# Patient Record
Sex: Male | Born: 1990 | Race: Black or African American | Hispanic: No | Marital: Single | Smoking: Current every day smoker
Health system: Southern US, Community
[De-identification: ages and names within clinical notes are randomized; demographics above are authoritative.]

## PROBLEM LIST (undated history)

## (undated) DIAGNOSIS — B019 Varicella without complication: Secondary | ICD-10-CM

## (undated) HISTORY — DX: Varicella without complication: B01.9

## (undated) HISTORY — PX: TONSILLECTOMY: SUR1361

---
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
## 2010-12-12 ENCOUNTER — Encounter: Payer: Self-pay | Admitting: *Deleted

## 2010-12-12 ENCOUNTER — Emergency Department (HOSPITAL_COMMUNITY)
Admission: EM | Admit: 2010-12-12 | Discharge: 2010-12-12 | Disposition: A | Discharge status: Home / Self Care | Payer: Managed Care, Other (non HMO) | Attending: Emergency Medicine | Admitting: Emergency Medicine

## 2010-12-12 DIAGNOSIS — L03211 Cellulitis of face: Secondary | ICD-10-CM | POA: Insufficient documentation

## 2010-12-12 DIAGNOSIS — L0201 Cutaneous abscess of face: Secondary | ICD-10-CM | POA: Insufficient documentation

## 2010-12-12 MED ORDER — IBUPROFEN 800 MG PO TABS: 800.0000 mg | ORAL_TABLET | Freq: Three times a day (TID) | ORAL | Status: AC | PRN

## 2010-12-12 MED ORDER — DOXYCYCLINE HYCLATE 100 MG PO CAPS: 100.0000 mg | ORAL_CAPSULE | Freq: Two times a day (BID) | ORAL | Status: AC

## 2010-12-12 NOTE — ED Provider Notes (Signed)
Medical screening examination/treatment/procedure(s) were performed by non-physician practitioner and as supervising physician I was immediately available for consultation/collaboration.  Autie Vasudevan, MD 12/12/10 1835 

## 2010-12-12 NOTE — ED Notes (Signed)
Small bump on head, now larger and painful

## 2010-12-12 NOTE — ED Provider Notes (Signed)
History     CSN: [id number] Arrival date & time: 12/12/2010 11:26 AM   First MD Initiated Contact with Patient 12/12/10 1341      Chief Complaint  Patient presents with  . Mass    (Consider location/radiation/quality/duration/timing/severity/associated sxs/prior treatment) HPI Comments: Patient with small area of redness and swelling on the forehead just superior to the bridge of the nose that began yesterday. Patient denies drainage from the area. Patient denies fever or other masses in head or neck. Patient has been treating himself at home with ibuprofen however no treatment prior to arrival.  Patient is a 20 y.o. male presenting with abscess. The history is provided by the patient.  Abscess  This is a new problem. The current episode started yesterday. The problem has been unchanged. The problem is mild. The abscess is characterized by swelling. Pertinent negatives include no fever. He has received no recent medical care.    History reviewed. No pertinent past medical history.  History reviewed. No pertinent past surgical history.  No family history on file.  History  Substance Use Topics  . Smoking status: Current Everyday Smoker  . Smokeless tobacco: Not on file  . Alcohol Use: Yes     social      Review of Systems  Constitutional: Negative for fever and chills.  Eyes: Negative for redness.  Musculoskeletal: Negative for myalgias.  Skin: Positive for color change. Negative for rash.  Neurological: Negative for headaches.  Hematological: Negative for adenopathy.    Allergies  Review of patient's allergies indicates no known allergies.  Home Medications   Current Outpatient Rx  Name Route Sig Dispense Refill  . VITAMIN C PO Oral Take 1 tablet by mouth as needed.        BP 122/56  Pulse 78  Temp(Src) 99.4 F (37.4 C) (Oral)  Resp 20  SpO2 100%  Physical Exam  Nursing note and vitals reviewed. Constitutional: He appears well-developed and  well-nourished.  HENT:  Head: Normocephalic and atraumatic.       Patient with small, less than 5 cm area of induration on the mid forehead just superior to bridge of nose. There is no drainage from the area. No surrounding cellulitis.  Eyes: Conjunctivae are normal. Pupils are equal, round, and reactive to light. Right eye exhibits no discharge. Left eye exhibits no discharge.  Musculoskeletal: He exhibits no edema.  Lymphadenopathy:    He has no cervical adenopathy.  Skin: Skin is warm and dry. No rash noted. There is erythema.  Psychiatric: He has a normal mood and affect.    ED Course  Procedures (including critical care time)  Labs Reviewed - No data to display No results found.   1. Abscess of forehead    Patient was seen and examined. Counseled on use of warm compresses. Counseled on use of anti-inflammatories. Urged patient to return with worsening swelling, redness spreading over his forehead, fever, or if he has any other concerns. He verbalizes understanding and agrees with plan.   MDM  Patient with small forehead abscess, no drainage performed 2/2 to small size and location. Antibiotics prescribed the patient will continue conservative treatment.        Eustace Moore [place], [place] 12/12/10 [phone]

## 2011-07-22 ENCOUNTER — Emergency Department (HOSPITAL_COMMUNITY)
Admission: EM | Admit: 2011-07-22 | Discharge: 2011-07-22 | Disposition: A | Discharge status: Home / Self Care | Payer: Managed Care, Other (non HMO) | Attending: Emergency Medicine | Admitting: Emergency Medicine

## 2011-07-22 ENCOUNTER — Encounter (HOSPITAL_COMMUNITY): Payer: Self-pay | Admitting: *Deleted

## 2011-07-22 DIAGNOSIS — J039 Acute tonsillitis, unspecified: Secondary | ICD-10-CM | POA: Insufficient documentation

## 2011-07-22 MED ORDER — HYDROCODONE-ACETAMINOPHEN 5-325 MG PO TABS: 2.0000 | ORAL_TABLET | ORAL | Status: AC | PRN

## 2011-07-22 MED ORDER — CLINDAMYCIN HCL 300 MG PO CAPS: 300.0000 mg | ORAL_CAPSULE | Freq: Four times a day (QID) | ORAL | Status: AC

## 2011-07-22 MED ORDER — ACETAMINOPHEN 325 MG PO TABS
650.0000 mg | ORAL_TABLET | Freq: Once | ORAL | Status: AC
  Administered 2011-07-22: 650 mg via ORAL
  Filled 2011-07-22: qty 2

## 2011-07-22 NOTE — ED Provider Notes (Signed)
History     CSN: [id number]  Arrival date & time 07/22/11  1939   First MD Initiated Contact with Patient 07/22/11 2019      Chief Complaint  Patient presents with  . Sore Throat   HPI  History provided by the patient. Patient is a 21 year old male with no significant past medical history who presents with complaints of acute onset sore throat and bilateral earache and fever last night and early this morning. He states symptoms began with slight ear pressure and ache last night and awoke with a significantly worse or throat and fever and chills all day long. Patient has continued to have good by mouth intake of fluids but does have slight decreased appetite. Patient also reports some occasional nausea but denies any episodes of vomiting. Has been no significant coughing. Patient denies any chest pain, shortness of breath or heart palpitations. He denies any skin rash.   History reviewed. No pertinent past medical history.  History reviewed. No pertinent past surgical history.  No family history on file.  History  Substance Use Topics  . Smoking status: Current Everyday Smoker  . Smokeless tobacco: Not on file  . Alcohol Use: Yes     social      Review of Systems  Constitutional: Positive for fever, chills and diaphoresis.  HENT: Positive for ear pain and sore throat. Negative for hearing loss.   Respiratory: Negative for cough.   Gastrointestinal: Positive for nausea. Negative for vomiting and abdominal pain.  Skin: Negative for rash.    Allergies  Review of patient's allergies indicates no known allergies.  Home Medications   Current Outpatient Rx  Name Route Sig Dispense Refill  . VITAMIN C 500 MG PO TABS Oral Take 500 mg by mouth daily.      BP 112/65  Pulse 110  Temp 102.9 F (39.4 C)  Resp 20  SpO2 100%  Physical Exam  Nursing note and vitals reviewed. Constitutional: He is oriented to person, place, and time. He appears well-developed and  well-nourished. No distress.  HENT:  Head: Normocephalic.  Right Ear: Tympanic membrane normal.  Left Ear: Tympanic membrane normal.       Tonsils enlarged bilaterally with erythema small amounts of exudate. Uvula midline. No signs for PTA.  Cardiovascular: Normal rate and regular rhythm.   Pulmonary/Chest: Effort normal and breath sounds normal.  Abdominal: Soft. There is no tenderness.  Lymphadenopathy:    He has cervical adenopathy.  Neurological: He is alert and oriented to person, place, and time.  Skin: Skin is warm.  Psychiatric: He has a normal mood and affect. His behavior is normal.    ED Course  Procedures   Results for orders placed during the hospital encounter of 07/22/11  RAPID STREP SCREEN      Component Value Range   Streptococcus, Group A Screen (Direct) NEGATIVE  NEGATIVE       1. Tonsillitis       MDM  8:20PM patient seen and evaluated. Patient does not appear acutely ill or toxic.        Angus Seller, [place] 07/22/11 2101

## 2011-07-22 NOTE — ED Notes (Signed)
Fever; sore throat; bilateral ear pain.

## 2011-07-22 NOTE — ED Notes (Signed)
Pt verbalizes understanding 

## 2011-07-22 NOTE — ED Provider Notes (Signed)
Medical screening examination/treatment/procedure(s) were performed by non-physician practitioner and as supervising physician I was immediately available for consultation/collaboration.   Robel Wuertz A Jazae Gandolfi, MD 07/22/11 2353 

## 2011-09-01 ENCOUNTER — Emergency Department (HOSPITAL_COMMUNITY)
Admission: EM | Admit: 2011-09-01 | Discharge: 2011-09-01 | Disposition: A | Discharge status: Home / Self Care | Payer: Managed Care, Other (non HMO) | Attending: Emergency Medicine | Admitting: Emergency Medicine

## 2011-09-01 ENCOUNTER — Encounter (HOSPITAL_COMMUNITY): Payer: Self-pay | Admitting: *Deleted

## 2011-09-01 DIAGNOSIS — F17200 Nicotine dependence, unspecified, uncomplicated: Secondary | ICD-10-CM | POA: Insufficient documentation

## 2011-09-01 DIAGNOSIS — IMO0002 Reserved for concepts with insufficient information to code with codable children: Secondary | ICD-10-CM | POA: Insufficient documentation

## 2011-09-01 DIAGNOSIS — J95830 Postprocedural hemorrhage and hematoma of a respiratory system organ or structure following a respiratory system procedure: Secondary | ICD-10-CM

## 2011-09-01 DIAGNOSIS — Y849 Medical procedure, unspecified as the cause of abnormal reaction of the patient, or of later complication, without mention of misadventure at the time of the procedure: Secondary | ICD-10-CM | POA: Insufficient documentation

## 2011-09-01 NOTE — ED Notes (Signed)
Pt reports having tonsillectomy on 8/20-reports waking up this am and his throat was bleeding, states he was spitting up blood.  No bleeding noted at this time.

## 2011-09-01 NOTE — ED Provider Notes (Signed)
History     CSN: [id number]  Arrival date & time 09/01/11  1226   First MD Initiated Contact with Patient 09/01/11 1310      Chief Complaint  Patient presents with  . Coagulation Disorder    bleeding    (Consider location/radiation/quality/duration/timing/severity/associated sxs/prior treatment) HPI Comments: Douglas Bartlett is a 21 y.o. Male who presents with complaint of post surgical bleeding. Pt states he had a tonsillectomy 1 week ago. States was doing well, today when he woke up, he noted bleeding from one of his tonsils. PT states he spit out about 5 mouth fulls of blood. States came here, but by the time he got here. Bleeding seemed to subside. Pt denies increased pain or injury. No headache, fever, dizziness.    History reviewed. No pertinent past medical history.  Past Surgical History  Procedure Date  . Tonsillectomy     No family history on file.  History  Substance Use Topics  . Smoking status: Current Some Day Smoker    Types: Cigars  . Smokeless tobacco: Not on file  . Alcohol Use: Yes     social      Review of Systems  Constitutional: Negative for fever and chills.  HENT: Positive for sore throat. Negative for congestion and drooling.   Respiratory: Negative.   Cardiovascular: Negative.   Skin: Negative.   Neurological: Negative for dizziness, weakness, light-headedness, numbness and headaches.  Hematological: Does not bruise/bleed easily.    Allergies  Review of patient's allergies indicates no known allergies.  Home Medications   Current Outpatient Rx  Name Route Sig Dispense Refill  . OXYCODONE-ACETAMINOPHEN 5-325 MG/5ML PO SOLN Oral Take 5-10 mLs by mouth every 4 (four) hours as needed. Pain      BP 114/72  Pulse 81  Temp 99 F (37.2 C) (Oral)  Resp 20  SpO2 99%  Physical Exam  Nursing note and vitals reviewed. Constitutional: He appears well-developed and well-nourished. No distress.  HENT:  Head: Normocephalic and atraumatic.   Right Ear: External ear normal.  Left Ear: External ear normal.  Nose: Nose normal.       Post surgical post tonsillectomy changes. There is mild oozing from the left tonsil with a clot formed at the site of the bleeding, otherwise normal. Uvula midline.   Neck: Neck supple.  Cardiovascular: Normal rate, regular rhythm and normal heart sounds.   Pulmonary/Chest: Effort normal and breath sounds normal. No respiratory distress. He has no wheezes. He has no rales.  Lymphadenopathy:    He has no cervical adenopathy.  Neurological: He is alert.  Skin: Skin is warm and dry.  Psychiatric: He has a normal mood and affect.    ED Course  Procedures (including critical care time)  Pt with post surgical bleeding form left tonsil. Exam and VS otherwise unremarkable. He is in no distress. I spoke with dr. Jearld Fenton who did her surgery. He advised ice water gargles, if bleeding stops, pt OK to be d/c home with follow up if bleeding starts again or reporting to [facility] ED.   2:28 PM Pt gargled ice cold water. His bleeding resolved. There is still a clot over left tonsil. Pt OK to go home. Voices understanding and will return to [facility] ER if worsening.     1. Hemorrhage following tonsillectomy       MDM          Lottie Mussel, PA 09/01/11 1519

## 2011-09-02 NOTE — ED Provider Notes (Signed)
Medical screening examination/treatment/procedure(s) were performed by non-physician practitioner and as supervising physician I was immediately available for consultation/collaboration.    Ladene Allocca R Jarrod Bodkins, MD 09/02/11 1640 

## 2014-06-15 ENCOUNTER — Encounter (HOSPITAL_COMMUNITY): Payer: Self-pay | Admitting: Emergency Medicine

## 2014-06-15 ENCOUNTER — Emergency Department (HOSPITAL_COMMUNITY): Payer: Managed Care, Other (non HMO)

## 2014-06-15 ENCOUNTER — Emergency Department (HOSPITAL_COMMUNITY)
Admission: EM | Admit: 2014-06-15 | Discharge: 2014-06-15 | Disposition: A | Discharge status: Home / Self Care | Payer: Managed Care, Other (non HMO) | Attending: Emergency Medicine | Admitting: Emergency Medicine

## 2014-06-15 DIAGNOSIS — S6991XA Unspecified injury of right wrist, hand and finger(s), initial encounter: Secondary | ICD-10-CM | POA: Diagnosis present

## 2014-06-15 DIAGNOSIS — S60511A Abrasion of right hand, initial encounter: Secondary | ICD-10-CM | POA: Insufficient documentation

## 2014-06-15 DIAGNOSIS — Z23 Encounter for immunization: Secondary | ICD-10-CM | POA: Insufficient documentation

## 2014-06-15 DIAGNOSIS — Z720 Tobacco use: Secondary | ICD-10-CM | POA: Insufficient documentation

## 2014-06-15 DIAGNOSIS — S60221A Contusion of right hand, initial encounter: Secondary | ICD-10-CM | POA: Insufficient documentation

## 2014-06-15 DIAGNOSIS — Y998 Other external cause status: Secondary | ICD-10-CM | POA: Insufficient documentation

## 2014-06-15 DIAGNOSIS — Y92013 Bedroom of single-family (private) house as the place of occurrence of the external cause: Secondary | ICD-10-CM | POA: Insufficient documentation

## 2014-06-15 DIAGNOSIS — Y9389 Activity, other specified: Secondary | ICD-10-CM | POA: Diagnosis not present

## 2014-06-15 DIAGNOSIS — W2209XA Striking against other stationary object, initial encounter: Secondary | ICD-10-CM | POA: Insufficient documentation

## 2014-06-15 MED ORDER — IBUPROFEN 600 MG PO TABS: 600.0000 mg | ORAL_TABLET | Freq: Four times a day (QID) | ORAL | Status: DC | PRN

## 2014-06-15 MED ORDER — TETANUS-DIPHTH-ACELL PERTUSSIS 5-2.5-18.5 LF-MCG/0.5 IM SUSP
0.5000 mL | Freq: Once | INTRAMUSCULAR | Status: AC
  Administered 2014-06-15: 0.5 mL via INTRAMUSCULAR
  Filled 2014-06-15: qty 0.5

## 2014-06-15 MED ORDER — HYDROCODONE-ACETAMINOPHEN 5-325 MG PO TABS
1.0000 | ORAL_TABLET | Freq: Once | ORAL | Status: AC
  Administered 2014-06-15: 1 via ORAL
  Filled 2014-06-15: qty 1

## 2014-06-15 MED ORDER — HYDROCODONE-ACETAMINOPHEN 5-325 MG PO TABS: 1.0000 | ORAL_TABLET | Freq: Four times a day (QID) | ORAL | Status: DC | PRN
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
## 2014-06-15 NOTE — Discharge Instructions (Signed)
You likely have bruising of the hand. Your x-ray is negative for break. He should take pain medication at home and use rest, ice, compression, and elevation for treatment.   Contusion A contusion is a deep bruise. Contusions are the result of an injury that caused bleeding under the skin. The contusion may turn blue, purple, or yellow. Minor injuries will give you a painless contusion, but more severe contusions may stay painful and swollen for a few weeks.  CAUSES  A contusion is usually caused by a blow, trauma, or direct force to an area of the body. SYMPTOMS   Swelling and redness of the injured area.  Bruising of the injured area.  Tenderness and soreness of the injured area.  Pain. DIAGNOSIS  The diagnosis can be made by taking a history and physical exam. An X-ray, CT scan, or MRI may be needed to determine if there were any associated injuries, such as fractures. TREATMENT  Specific treatment will depend on what area of the body was injured. In general, the best treatment for a contusion is resting, icing, elevating, and applying cold compresses to the injured area. Over-the-counter medicines may also be recommended for pain control. Ask your caregiver what the best treatment is for your contusion. HOME CARE INSTRUCTIONS   Put ice on the injured area.  Put ice in a plastic bag.  Place a towel between your skin and the bag.  Leave the ice on for 15-20 minutes, 3-4 times a day, or as directed by your health care provider.  Only take over-the-counter or prescription medicines for pain, discomfort, or fever as directed by your caregiver. Your caregiver may recommend avoiding anti-inflammatory medicines (aspirin, ibuprofen, and naproxen) for 48 hours because these medicines may increase bruising.  Rest the injured area.  If possible, elevate the injured area to reduce swelling. SEEK IMMEDIATE MEDICAL CARE IF:   You have increased bruising or swelling.  You have pain that is  getting worse.  Your swelling or pain is not relieved with medicines. MAKE SURE YOU:   Understand these instructions.  Will watch your condition.  Will get help right away if you are not doing well or get worse. Document Released: 09/30/2004 Document Revised: 12/26/2012 Document Reviewed: 10/26/2010 [facility] Patient Information 2015 [place], [place]. This information is not intended to replace advice given to you by your health care provider. Make sure you discuss any questions you have with your health care provider.

## 2014-06-24 ENCOUNTER — Emergency Department (HOSPITAL_COMMUNITY)
Admission: EM | Admit: 2014-06-24 | Discharge: 2014-06-24 | Disposition: A | Discharge status: Home / Self Care | Payer: Managed Care, Other (non HMO) | Attending: Emergency Medicine | Admitting: Emergency Medicine

## 2014-06-24 ENCOUNTER — Encounter (HOSPITAL_COMMUNITY): Payer: Self-pay | Admitting: *Deleted

## 2014-06-24 DIAGNOSIS — S80862A Insect bite (nonvenomous), left lower leg, initial encounter: Secondary | ICD-10-CM | POA: Diagnosis not present

## 2014-06-24 DIAGNOSIS — Y92009 Unspecified place in unspecified non-institutional (private) residence as the place of occurrence of the external cause: Secondary | ICD-10-CM | POA: Diagnosis not present

## 2014-06-24 DIAGNOSIS — Z720 Tobacco use: Secondary | ICD-10-CM | POA: Diagnosis not present

## 2014-06-24 DIAGNOSIS — Y999 Unspecified external cause status: Secondary | ICD-10-CM | POA: Diagnosis not present

## 2014-06-24 DIAGNOSIS — Y939 Activity, unspecified: Secondary | ICD-10-CM | POA: Diagnosis not present

## 2014-06-24 DIAGNOSIS — S50861A Insect bite (nonvenomous) of right forearm, initial encounter: Secondary | ICD-10-CM | POA: Diagnosis not present

## 2014-06-24 DIAGNOSIS — W57XXXA Bitten or stung by nonvenomous insect and other nonvenomous arthropods, initial encounter: Secondary | ICD-10-CM | POA: Diagnosis not present

## 2014-06-24 DIAGNOSIS — S40861A Insect bite (nonvenomous) of right upper arm, initial encounter: Secondary | ICD-10-CM | POA: Insufficient documentation

## 2014-06-24 NOTE — ED Notes (Signed)
Pt complains of a rash that started yesterday. Pt states he initially thought he had a mosquito bite on his right arm, but that the itching became worse and spread to his left leg and right arm.

## 2014-06-24 NOTE — ED Notes (Signed)
Pt reports mosquito bites to R posterior FA yesterday, woke up today with swelling and redness to bite area.

## 2014-06-24 NOTE — Discharge Instructions (Signed)
RECOMMEND TOPICAL BENADRYL FOR ITCHING AND IBUPROFEN FOR INFLAMMATION AND DISCOMFORT.   Insect Bite Mosquitoes, flies, fleas, bedbugs, and many other insects can bite. Insect bites are different from insect stings. A sting is when venom is injected into the skin. Some insect bites can transmit infectious diseases. SYMPTOMS  Insect bites usually turn red, swell, and itch for 2 to 4 days. They often go away on their own. TREATMENT  Your caregiver may prescribe antibiotic medicines if a bacterial infection develops in the bite. HOME CARE INSTRUCTIONS  Do not scratch the bite area.  Keep the bite area clean and dry. Wash the bite area thoroughly with soap and water.  Put ice or cool compresses on the bite area.  Put ice in a plastic bag.  Place a towel between your skin and the bag.  Leave the ice on for 20 minutes, 4 times a day for the first 2 to 3 days, or as directed.  You may apply a baking soda paste, cortisone cream, or calamine lotion to the bite area as directed by your caregiver. This can help reduce itching and swelling.  Only take over-the-counter or prescription medicines as directed by your caregiver.  If you are given antibiotics, take them as directed. Finish them even if you start to feel better. You may need a tetanus shot if:  You cannot remember when you had your last tetanus shot.  You have never had a tetanus shot.  The injury broke your skin. If you get a tetanus shot, your arm may swell, get red, and feel warm to the touch. This is common and not a problem. If you need a tetanus shot and you choose not to have one, there is a rare chance of getting tetanus. Sickness from tetanus can be serious. SEEK IMMEDIATE MEDICAL CARE IF:   You have increased pain, redness, or swelling in the bite area.  You see a red line on the skin coming from the bite.  You have a fever.  You have joint pain.  You have a headache or neck pain.  You have unusual  weakness.  You have a rash.  You have chest pain or shortness of breath.  You have abdominal pain, nausea, or vomiting.  You feel unusually tired or sleepy. MAKE SURE YOU:   Understand these instructions.  Will watch your condition.  Will get help right away if you are not doing well or get worse. Document Released: 01/29/2004 Document Revised: 03/15/2011 Document Reviewed: 07/22/2010 [facility] Patient Information 2015 [place], [place]. This information is not intended to replace advice given to you by your health care provider. Make sure you discuss any questions you have with your health care provider.

## 2014-06-24 NOTE — ED Provider Notes (Signed)
CSN: [id number]     Arrival date & time 06/24/14  1906 History  This chart was scribed for non-physician practitioner Elpidio Anis, PA-C working with Gilda Crease, MD by Lyndel Safe, ED Scribe. This patient was seen in room WTR8/WTR8 and the patient's care was started at 8:34 PM.   Chief Complaint  Patient presents with  . Rash   The history is provided by the patient. No language interpreter was used.   HPI Comments: Douglas Bartlett is a 24 y.o. male, with no pertinent PMhx, who presents to the Emergency Department complaining of progressively spreading, constant, moderate pruritic bumps onset 1 day ago. The pt states he thought he had mosquito bites on his right forearm yesterday but since he has noticed the bumps on his right arm/hand and left leg with a worsening pruritic quality. Pt reports he has been at his place of residence for 5 years. Denies fever, SOB, or dyspnea.    History reviewed. No pertinent past medical history. Past Surgical History  Procedure Laterality Date  . Tonsillectomy     No family history on file. History  Substance Use Topics  . Smoking status: Current Some Day Smoker    Types: Cigars  . Smokeless tobacco: Not on file  . Alcohol Use: Yes     Comment: social    Review of Systems  Constitutional: Negative for fever.  Respiratory: Negative for shortness of breath.   Skin: Positive for rash.    Allergies  Review of patient's allergies indicates no known allergies.  Home Medications   Prior to Admission medications   Medication Sig Start Date End Date Taking? Authorizing Provider  HYDROcodone-acetaminophen (NORCO/VICODIN) 5-325 MG per tablet Take 1 tablet by mouth every 6 (six) hours as needed for moderate pain. 06/15/14   Shon Baton, MD  ibuprofen (ADVIL,MOTRIN) 600 MG tablet Take 1 tablet (600 mg total) by mouth every 6 (six) hours as needed. 06/15/14   Shon Baton, MD  oxyCODONE-acetaminophen (ROXICET) 5-325 MG/5ML  solution Take 5-10 mLs by mouth every 4 (four) hours as needed. Pain    Historical Provider, MD   BP 141/80 mmHg  Pulse 60  Temp(Src) 98.2 F (36.8 C) (Oral)  Resp 18  SpO2 100% Physical Exam  Constitutional: He is oriented to person, place, and time. He appears well-developed and well-nourished. No distress.  HENT:  Head: Normocephalic.  Cardiovascular: Normal rate.   Pulmonary/Chest: Effort normal.  Musculoskeletal: Normal range of motion.  Neurological: He is alert and oriented to person, place, and time.  Skin: Skin is warm.  Diffuse, singular, raised bumps slightly erythematous. No blistering. No pustules. Consistent with insect bites.   Psychiatric: He has a normal mood and affect. His behavior is normal.  Nursing note and vitals reviewed.   ED Course  Procedures  DIAGNOSTIC STUDIES: Oxygen Saturation is 100% on RA, normal by my interpretation.    COORDINATION OF CARE: 8:35 PM Discussed treatment plan with pt. Discussed with pt that rash is likely from bed bugs. Advised pt to treat his home for bed bugs. Pt acknowledges and agrees to plan.   Labs Review Labs Reviewed - No data to display  Imaging Review No results found.   EKG Interpretation None      MDM   Final diagnoses:  None    1. Insect bites  Uncomplicated insect bites without secondary infection.  I personally performed the services described in this documentation, which was scribed in my presence. The recorded information has  been reviewed and is accurate.     Elpidio Anis, PA-C 06/25/14 1324  Gilda Crease, MD 07/01/14 [phone]

## 2014-11-12 ENCOUNTER — Other Ambulatory Visit (INDEPENDENT_AMBULATORY_CARE_PROVIDER_SITE_OTHER): Payer: Managed Care, Other (non HMO)

## 2014-11-12 ENCOUNTER — Telehealth: Payer: Self-pay | Admitting: Family

## 2014-11-12 ENCOUNTER — Ambulatory Visit (INDEPENDENT_AMBULATORY_CARE_PROVIDER_SITE_OTHER): Payer: Managed Care, Other (non HMO) | Admitting: Family

## 2014-11-12 ENCOUNTER — Encounter: Payer: Self-pay | Admitting: Family

## 2014-11-12 VITALS — BP 110/68 | HR 87 | Temp 98.6°F | Resp 18 | Ht 74.0 in | Wt 211.0 lb

## 2014-11-12 DIAGNOSIS — R55 Syncope and collapse: Secondary | ICD-10-CM

## 2014-11-12 LAB — CBC
HEMATOCRIT: 46.4 % (ref 39.0–52.0)
Hemoglobin: 15.4 g/dL (ref 13.0–17.0)
MCHC: 33.2 g/dL (ref 30.0–36.0)
MCV: 87 fl (ref 78.0–100.0)
Platelets: 281 10*3/uL (ref 150.0–400.0)
RBC: 5.34 Mil/uL (ref 4.22–5.81)
RDW: 13.9 % (ref 11.5–15.5)
WBC: 5.1 10*3/uL (ref 4.0–10.5)

## 2014-11-12 LAB — COMPREHENSIVE METABOLIC PANEL
ALBUMIN: 4.3 g/dL (ref 3.5–5.2)
ALT: 21 U/L (ref 0–53)
AST: 19 U/L (ref 0–37)
Alkaline Phosphatase: 61 U/L (ref 39–117)
BILIRUBIN TOTAL: 0.7 mg/dL (ref 0.2–1.2)
BUN: 11 mg/dL (ref 6–23)
CHLORIDE: 105 meq/L (ref 96–112)
CO2: 28 meq/L (ref 19–32)
CREATININE: 0.95 mg/dL (ref 0.40–1.50)
Calcium: 9.5 mg/dL (ref 8.4–10.5)
GFR: 124.45 mL/min (ref >60.00)
Glucose, Bld: 89 mg/dL (ref 70–99)
Potassium: 4.1 mEq/L (ref 3.5–5.1)
SODIUM: 139 meq/L (ref 135–145)
Total Protein: 7.8 g/dL (ref 6.0–8.3)

## 2014-11-12 LAB — HEMOGLOBIN A1C: HEMOGLOBIN A1C: 5.4 % (ref 4.6–6.5)

## 2014-11-12 NOTE — Patient Instructions (Addendum)
Thank you for choosing [facility].  Summary/Instructions:  Please stop by the lab on the basement level of the building for your blood work. Your results will be released to MyChart (or called to you) after review, usually within 72 hours after test completion. If any changes need to be made, you will be notified at that same time.  If your symptoms worsen or fail to improve, please contact our office for further instruction, or in case of emergency go directly to the emergency room at the closest medical facility.    Syncope Syncope is a medical term for fainting or passing out. This means you lose consciousness and drop to the ground. People are generally unconscious for less than 5 minutes. You may have some muscle twitches for up to 15 seconds before waking up and returning to normal. Syncope occurs more often in older adults, but it can happen to anyone. While most causes of syncope are not dangerous, syncope can be a sign of a serious medical problem. It is important to seek medical care.  CAUSES  Syncope is caused by a sudden drop in blood flow to the brain. The specific cause is often not determined. Factors that can bring on syncope include:  Taking medicines that lower blood pressure.  Sudden changes in posture, such as standing up quickly.  Taking more medicine than prescribed.  Standing in one place for too long.  Seizure disorders.  Dehydration and excessive exposure to heat.  Low blood sugar (hypoglycemia).  Straining to have a bowel movement.  Heart disease, irregular heartbeat, or other circulatory problems.  Fear, emotional distress, seeing blood, or severe pain. SYMPTOMS  Right before fainting, you may:  Feel dizzy or light-headed.  Feel nauseous.  See all white or all black in your field of vision.  Have cold, clammy skin. DIAGNOSIS  Your health care provider will ask about your symptoms, perform a physical exam, and perform an electrocardiogram  (ECG) to record the electrical activity of your heart. Your health care provider may also perform other heart or blood tests to determine the cause of your syncope which may include:  Transthoracic echocardiogram (TTE). During echocardiography, sound waves are used to evaluate how blood flows through your heart.  Transesophageal echocardiogram (TEE).  Cardiac monitoring. This allows your health care provider to monitor your heart rate and rhythm in real time.  Holter monitor. This is a portable device that records your heartbeat and can help diagnose heart arrhythmias. It allows your health care provider to track your heart activity for several days, if needed.  Stress tests by exercise or by giving medicine that makes the heart beat faster. TREATMENT  In most cases, no treatment is needed. Depending on the cause of your syncope, your health care provider may recommend changing or stopping some of your medicines. HOME CARE INSTRUCTIONS  Have someone stay with you until you feel stable.  Do not drive, use machinery, or play sports until your health care provider says it is okay.  Keep all follow-up appointments as directed by your health care provider.  Lie down right away if you start feeling like you might faint. Breathe deeply and steadily. Wait until all the symptoms have passed.  Drink enough fluids to keep your urine clear or pale yellow.  If you are taking blood pressure or heart medicine, get up slowly and take several minutes to sit and then stand. This can reduce dizziness. SEEK IMMEDIATE MEDICAL CARE IF:   You have a severe  headache.  You have unusual pain in the chest, abdomen, or back.  You are bleeding from your mouth or rectum, or you have black or tarry stool.  You have an irregular or very fast heartbeat.  You have pain with breathing.  You have repeated fainting or seizure-like jerking during an episode.  You faint when sitting or lying down.  You have  confusion.  You have trouble walking.  You have severe weakness.  You have vision problems. If you fainted, call your local emergency services (911 in [place] Do not drive yourself to the hospital.    This information is not intended to replace advice given to you by your health care provider. Make sure you discuss any questions you have with your health care provider.   Document Released: 12/21/2004 Document Revised: 05/07/2014 Document Reviewed: 02/19/2011 Elsevier Interactive Patient Education [employer]2016 Elsevier Inc.

## 2014-11-12 NOTE — Progress Notes (Signed)
Pre visit review using our clinic review tool, if applicable. No additional management support is needed unless otherwise documented below in the visit note. 

## 2014-11-12 NOTE — Assessment & Plan Note (Signed)
Symptoms of multiple syncopal episodes of undetermined origin and cannot rule out vasovagal events. Most recent event occurred with alcohol and does not recall how much he drank that evening. Obtain A1c, CMET and CBC to rule out potential metabolic causes. In office EKG revealed normal sinus rhythm with non-specific changes that are problaby normal for age. Obtain 2D echo. Follow up pending lab work and imaging.

## 2014-11-12 NOTE — Telephone Encounter (Signed)
Please inform patient that his blood work is all within the normal limits including his kidney function, liver function, electrolytes, white/red blood cells, and A1c. Therefore please continue with the 2-D echocardiogram to rule out any cardiac involvement. With this information it appears as his symptoms may be related to the vasovagal symptoms that we discussed during his office visit. Follow-up pending his 2-D echocardiogram.

## 2014-11-12 NOTE — Progress Notes (Signed)
Subjective:    Patient ID: Douglas Bartlett, male    DOB: 1990-09-01, 24 y.o.   MRN: [id number]  Chief Complaint  Patient presents with  . Establish Care    has had issues with passing out, has happend 4 times since he has had his tonsils removed in 2013    HPI:  Douglas Bartlett is a 24 y.o. male who  has a past medical history of Chicken pox. and presents today for an office visit to establish care.  1.) Syncopal episodes - Approximately 3 weeks ago he was out with friends and noted feeling hot and warm and then next thing that he remembers is people standing over him and describes a feeling of weakness that follows for about 5-10 minutes and then he is improved. There was alcohol involved.Frequency has occurred about 4 times within the last 3 years. Denies any modifying factors or treatments that make it better or worse. Does have a history of trauma falling down the steps when he was in middle school hitting his head with no LOC. Currently reports no symptoms.   No Known Allergies   Outpatient Prescriptions Prior to Visit  Medication Sig Dispense Refill  . HYDROcodone-acetaminophen (NORCO/VICODIN) 5-325 MG per tablet Take 1 tablet by mouth every 6 (six) hours as needed for moderate pain. 10 tablet 0  . ibuprofen (ADVIL,MOTRIN) 600 MG tablet Take 1 tablet (600 mg total) by mouth every 6 (six) hours as needed. 30 tablet 0  . oxyCODONE-acetaminophen (ROXICET) 5-325 MG/5ML solution Take 5-10 mLs by mouth every 4 (four) hours as needed. Pain     No facility-administered medications prior to visit.     Past Medical History  Diagnosis Date  . Chicken pox      Past Surgical History  Procedure Laterality Date  . Tonsillectomy       Family History  Problem Relation Age of Onset  . Hypertension Mother   . Healthy Father      Social History   Social History  . Marital Status: Single    Spouse Name: N/A  . Number of Children: 0  . Years of Education: 12   Occupational  History  . 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 Smoking status: Former Smoker    Types: Cigars  . Smokeless tobacco: Never Used  . Alcohol Use: Yes     Comment: social  . Drug Use: No  . Sexual Activity: Not on file   Other Topics Concern  . Not on file   Social History Narrative   Fun: Sherri RadHang out with friends   Denies religious beliefs effecting health care.        Review of Systems  Constitutional: Negative for fever and chills.  Respiratory: Negative for chest tightness and shortness of breath.   Cardiovascular: Negative for chest pain, palpitations and leg swelling.  Neurological: Negative for headaches.      Objective:    BP 110/68 mmHg  Pulse 87  Temp(Src) 98.6 F (37 C) (Oral)  Resp 18  Ht 6\' 2"  (1.88 m)  Wt 211 lb (95.709 kg)  BMI 27.08 kg/m2  SpO2 99% Nursing note and vital signs reviewed.  Physical Exam  Constitutional: He is oriented to person, place, and time. He appears well-developed and well-nourished. No distress.  Cardiovascular: Normal rate, regular rhythm, normal heart sounds and intact distal pulses.   Pulmonary/Chest: Effort normal and breath sounds normal.  Neurological: He is alert and oriented to person,  place, and time.  Skin: Skin is warm and dry.  Psychiatric: He has a normal mood and affect. His behavior is normal. Judgment and thought content normal.       Assessment & Plan:   Problem List Items Addressed This Visit      Cardiovascular and Mediastinum   Syncopal episodes - Primary    Symptoms of multiple syncopal episodes of undetermined origin and cannot rule out vasovagal events. Most recent event occurred with alcohol and does not recall how much he drank that evening. Obtain A1c, CMET and CBC to rule out potential metabolic causes. In office EKG revealed normal sinus rhythm with non-specific changes that are problaby normal for age. Obtain 2D echo. Follow up pending lab work and imaging.       Relevant Orders    Comprehensive metabolic panel   CBC   Hemoglobin A1c   EKG 12-Lead (Completed)   Echocardiogram

## 2014-11-14 NOTE — Telephone Encounter (Signed)
Tried to call pt. No answer and VM was full. Will try back later

## 2014-11-19 ENCOUNTER — Telehealth (HOSPITAL_COMMUNITY): Payer: Self-pay | Admitting: *Deleted

## 2014-11-25 NOTE — Telephone Encounter (Signed)
Results sent in the mail. 

## 2014-12-02 ENCOUNTER — Ambulatory Visit (HOSPITAL_COMMUNITY): Payer: Managed Care, Other (non HMO) | Attending: Internal Medicine

## 2014-12-02 ENCOUNTER — Telehealth: Payer: Self-pay | Admitting: Family

## 2014-12-02 ENCOUNTER — Other Ambulatory Visit: Payer: Self-pay

## 2014-12-02 DIAGNOSIS — R55 Syncope and collapse: Secondary | ICD-10-CM | POA: Diagnosis present

## 2014-12-02 NOTE — Telephone Encounter (Signed)
Please inform patient that his Echocardiogram was normal. Therefore his passing out may be related to a vasovagal episode and does not appear to be cardiac related.

## 2014-12-06 NOTE — Telephone Encounter (Signed)
LVM letting pt know.  

## 2015-03-27 ENCOUNTER — Ambulatory Visit (INDEPENDENT_AMBULATORY_CARE_PROVIDER_SITE_OTHER): Payer: Managed Care, Other (non HMO) | Admitting: Physician Assistant

## 2015-03-27 VITALS — BP 122/74 | HR 68 | Temp 98.9°F | Resp 16 | Ht 73.0 in | Wt 219.0 lb

## 2015-03-27 DIAGNOSIS — S239XXA Sprain of unspecified parts of thorax, initial encounter: Secondary | ICD-10-CM

## 2015-03-27 MED ORDER — CYCLOBENZAPRINE HCL 5 MG PO TABS: 5.0000 mg | ORAL_TABLET | Freq: Three times a day (TID) | ORAL | Status: DC | PRN

## 2015-03-27 MED ORDER — DICLOFENAC SODIUM 75 MG PO TBEC
75.0000 mg | DELAYED_RELEASE_TABLET | Freq: Two times a day (BID) | ORAL | Status: DC
Start: 2015-03-27 — End: 2015-05-26

## 2015-03-27 NOTE — Progress Notes (Signed)
Subjective:     Patient ID: Douglas Bartlett, male   DOB: Apr 29, 1990, 25 y.o.   MRN: [id number]  HPI  The patient is a 25 year old healthy male who presents with 3 days of upper back pain. He woke up 2 mornings ago with sharp pain, states the pain occurs only with certain movements, and when heavy lifting. Location is in the lower left boarder of the left scapula. Denies pain radiation to the next, upper neck, or lower back. No nausea, vomiting, no neurologic deficits. States it is a sharp pain, stabbing 7/10. Denies headache, dizziness or difficulty with arm movements.   Review of Systems All pertinent ROS as above in HPI    Objective:   Physical Exam  Constitutional: He appears well-developed and well-nourished.  Eyes: EOM are normal. Pupils are equal, round, and reactive to light.  Neck: Normal range of motion. Neck supple.  Musculoskeletal: Normal range of motion.  Full active ROM of bilateral upper extremities, pain with shoulder internal rotation and flexion. Full 5/5 muscle strength in all upper extremities bilaterally.  Mild tenderness to palpation over the lower scapula boarder, no muscle spasm noted.  Cervical, thoracic and lumbar vertebrae non tender to palpation  Neurological: He is alert. He has normal reflexes. No cranial nerve deficit.       Assessment:     The patient is a 25 year old otherwise healthy male who lifts heavy objects for work, woke up two days ago with sharp pain in his left upper back. Likely a rhomboid or subscapularis muscle spasm/sprain will treat symptomatically and encourage patient to continue to use muscle to avoid further damage or atrophy.    Plan:     1. Thoracic back sprain, initial encounter - diclofenac (VOLTAREN) 75 MG EC tablet; Take 1 tablet (75 mg total) by mouth 2 (two) times daily.  Dispense: 30 tablet; Refill: 0 - cyclobenzaprine (FLEXERIL) 5 MG tablet; Take 1 tablet (5 mg total) by mouth 3 (three) times daily as needed for muscle spasms.   Dispense: 30 tablet; Refill: 0 - Continue to use heating pad and use muscle indicated

## 2015-03-27 NOTE — Progress Notes (Signed)
   Douglas Bartlett  MRN: [id number] DOB: [birth year]  Subjective:  The patient is a 25 year old healthy male who presents with 3 days of upper back pain. He woke up 2 mornings ago with sharp pain, states the pain occurs only with certain movements, and when heavy lifting. Location is in the lower left boarder of the left scapula. Denies pain radiation to the next, upper neck, or lower back. No nausea, vomiting, no neurologic deficits. States it is a sharp pain, stabbing 7/10. Denies headache, dizziness or difficulty with arm movements.   Patient Active Problem List   Diagnosis Date Noted  . Syncopal episodes 11/12/2014    No current outpatient prescriptions on file prior to visit.   No current facility-administered medications on file prior to visit.    No Known Allergies  Review of Systems  Musculoskeletal: Positive for back pain.   Objective:  BP 122/74 mmHg  Pulse 68  Temp(Src) 98.9 F (37.2 C)  Resp 16  Ht 6\' 1"  (1.854 m)  Wt 219 lb (99.338 kg)  BMI 28.90 kg/m2  SpO2 98%  Physical Exam  Constitutional: He is oriented to person, place, and time and well-developed, well-nourished, and in no distress.  HENT:  Head: Normocephalic and atraumatic.  Right Ear: External ear normal.  Left Ear: External ear normal.  Eyes: Conjunctivae are normal.  Neck: Normal range of motion.  Pulmonary/Chest: Effort normal.  Musculoskeletal:       Thoracic back: He exhibits tenderness. He exhibits normal range of motion, no bony tenderness and no spasm.       Back:  Neurological: He is alert and oriented to person, place, and time. He has normal sensation, normal strength and normal reflexes. Gait normal.  Skin: Skin is warm and dry.  Psychiatric: Mood, memory, affect and judgment normal.    Assessment and Plan :  Thoracic back sprain, initial encounter - Plan: diclofenac (VOLTAREN) 75 MG EC tablet, cyclobenzaprine (FLEXERIL) 5 MG tablet   Heat and gentle stretches - he will be off  tomorrow with the provided note to allow him to take the medication vs work around machinery.  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  He will need to RTC for a return to work fit for duty exam.  Benny LennertSarah Weber PA-C  [facility] 03/27/2015 2:34 PM

## 2015-03-27 NOTE — Patient Instructions (Addendum)
  Heat and gentle exercises - do not stop using the muscle as that will make it worse. Be mindful that the muscle relaxer may make you sleepy so do not use the 1st dose before you know how it will affect you.   IF you received an x-ray today, you will receive an invoice from [facility]. Please contact [facility] at [phone] with questions or concerns regarding your invoice.   IF you received labwork today, you will receive an invoice from [facility]. Please contact [facility] at [phone] with questions or concerns regarding your invoice.   Our billing staff will not be able to assist you with questions regarding bills from these companies.  You will be contacted with the lab results as soon as they are available. The fastest way to get your results is to activate your My Chart account. Instructions are located on the last page of this paperwork. If you have not heard from us regarding the results in 2 weeks, please contact this office.

## 2015-03-31 ENCOUNTER — Ambulatory Visit (INDEPENDENT_AMBULATORY_CARE_PROVIDER_SITE_OTHER): Payer: Managed Care, Other (non HMO) | Admitting: Physician Assistant

## 2015-03-31 VITALS — BP 120/80 | HR 75 | Temp 98.9°F | Resp 16 | Ht 73.0 in | Wt 220.0 lb

## 2015-03-31 DIAGNOSIS — S239XXA Sprain of unspecified parts of thorax, initial encounter: Secondary | ICD-10-CM

## 2015-03-31 DIAGNOSIS — Z09 Encounter for follow-up examination after completed treatment for conditions other than malignant neoplasm: Secondary | ICD-10-CM | POA: Diagnosis not present

## 2015-03-31 NOTE — Patient Instructions (Addendum)
     IF you received an x-ray today, you will receive an invoice from [facility]. Please contact [facility] at [phone] with questions or concerns regarding your invoice.   IF you received labwork today, you will receive an invoice from [facility]. Please contact [facility] at [phone] with questions or concerns regarding your invoice.   Our billing staff will not be able to assist you with questions regarding bills from these companies.  You will be contacted with the lab results as soon as they are available. The fastest way to get your results is to activate your My Chart account. Instructions are located on the last page of this paperwork. If you have not heard from us regarding the results in 2 weeks, please contact this office.    Continue to do stretches 3 times per day.   Take it easy for this next week.  Be mindful of your lifting and positioning prior to lifting.   I also would like you to start exercising your core to strengthen your back, abdomen, and shoulders.  This will help you to distribute the weight of heavy materials as well as avoiding strains if you contort your body a certain type of way.

## 2015-03-31 NOTE — Progress Notes (Signed)
[facility] [address], [place] [address] [phone]  Date:  03/31/2015   Name:  Douglas Lindennthony L Casto   DOB:  [birth year]   MRN:  [id number]  PCP:  Jeanine Luzalone, Gregory, FNP   Chief Complaint  Patient presents with  . Follow-up    return to duty     History of Present Illness:  Douglas Bartlett is a 25 y.o. male patient who [facility].    He reports resolve of his thoracic back pain. He has been doing stretches at home, and using a heating pad on his thoracic. He states that the muscle relaxer helped dramatically.  He has been off work as restriction for this thoracic back pain.     Patient Active Problem List   Diagnosis Date Noted  . Syncopal episodes 11/12/2014    Past Medical History  Diagnosis Date  . Chicken pox     Past Surgical History  Procedure Laterality Date  . Tonsillectomy      Social History  Substance Use Topics  . Smoking status: Former Smoker    Types: Cigarettes    Quit date: 01/05/2011  . Smokeless tobacco: Never Used  . Alcohol Use: 6.0 oz/week    10 Cans of beer per week     Comment: social    Family History  Problem Relation Age of Onset  . Hypertension Mother   . Healthy Father     No Known Allergies  Medication list has been reviewed and updated.  Current Outpatient Prescriptions on File Prior to Visit  Medication Sig Dispense Refill  . cyclobenzaprine (FLEXERIL) 5 MG tablet Take 1 tablet (5 mg total) by mouth 3 (three) times daily as needed for muscle spasms. 30 tablet 0  . diclofenac (VOLTAREN) 75 MG EC tablet Take 1 tablet (75 mg total) by mouth 2 (two) times daily. 30 tablet 0   No current facility-administered medications on file prior to visit.    ROS ROS otherwise unremarkable unless listed above.  Physical Examination: BP 120/80 mmHg  Pulse 75  Temp(Src) 98.9 F (37.2 C) (Oral)  Resp 16  Ht 6\' 1"  (1.854 m)  Wt 220 lb (99.791 kg)  BMI 29.03 kg/m2  SpO2  97% Ideal Body Weight: Weight in (lb) to have BMI = 25: 189.1  Physical Exam  Constitutional: He is oriented to person, place, and time. He appears well-developed and well-nourished. No distress.  HENT:  Head: Normocephalic and atraumatic.  Eyes: Conjunctivae and EOM are normal. Pupils are equal, round, and reactive to light.  Cardiovascular: Normal rate.   Pulmonary/Chest: Effort normal. No respiratory distress.  Musculoskeletal:       Thoracic back: Normal. He exhibits normal range of motion, no tenderness, no bony tenderness, no swelling, no pain and no spasm.  Neurological: He is alert and oriented to person, place, and time.  Skin: Skin is warm and dry. He is not diaphoretic.  Psychiatric: He has a normal mood and affect. His behavior is normal.     Assessment and Plan: Douglas Lindennthony L Brannick is a 25 y.o. male who is here today for follow up of thoracic back sprain. Resolved--fit for duty form completed. Thoracic back sprain, initial encounter  Follow up   Trena PlattStephanie English, PA-C [facility] 03/31/2015 10:44 AM

## 2015-05-26 ENCOUNTER — Ambulatory Visit (INDEPENDENT_AMBULATORY_CARE_PROVIDER_SITE_OTHER): Payer: Managed Care, Other (non HMO) | Admitting: Family

## 2015-05-26 ENCOUNTER — Encounter: Payer: Self-pay | Admitting: Family

## 2015-05-26 VITALS — BP 122/80 | HR 73 | Temp 98.4°F | Resp 14 | Ht 73.0 in | Wt 216.0 lb

## 2015-05-26 DIAGNOSIS — R21 Rash and other nonspecific skin eruption: Secondary | ICD-10-CM | POA: Diagnosis not present

## 2015-05-26 MED ORDER — TRIAMCINOLONE ACETONIDE 0.025 % EX OINT: 1.0000 "application " | TOPICAL_OINTMENT | Freq: Two times a day (BID) | CUTANEOUS | Status: DC

## 2015-05-26 NOTE — Patient Instructions (Signed)
Thank you for choosing [facility].  Summary/Instructions:  Your prescription(s) have been submitted to your pharmacy or been printed and provided for you. Please take as directed and contact our office if you believe you are having problem(s) with the medication(s) or have any questions.  If your symptoms worsen or fail to improve, please contact our office for further instruction, or in case of emergency go directly to the emergency room at the closest medical facility.     

## 2015-05-26 NOTE — Assessment & Plan Note (Signed)
Itchy rash with raised lesion most likely allergic but cannot rule out fungal. Does not appear to be STI related. Start triamcinolone. Follow up if symptoms worsen or do not improve.

## 2015-05-26 NOTE — Progress Notes (Signed)
Pre visit review using our clinic review tool, if applicable. No additional management support is needed unless otherwise documented below in the visit note. 

## 2015-05-26 NOTE — Progress Notes (Signed)
   Subjective:    Patient ID: Douglas Bartlett, male    DOB: 11/04/1990, 25 y.o.   MRN: [id number]  Chief Complaint  Patient presents with  . Follow-up    has some spots on his scrotum that he is concerned about    HPI:  Douglas Bartlett is a 25 y.o. male who  has a past medical history of Chicken pox. and presents today for an office visit.   This is a new problem. Associated symptom of spots located on his scrotum that has been going on for a couple of weeks. Described as itchy and dry patches that have spread since initial onset. Denies any modifying factors/treatment that make it better.. Denies any changes to soaps, detergents, body care products or clothing.   No Known Allergies   No current outpatient prescriptions on file prior to visit.   No current facility-administered medications on file prior to visit.    Review of Systems  Constitutional: Negative for fever and chills.  Genitourinary: Negative for dysuria, hematuria, decreased urine volume, discharge, penile swelling, scrotal swelling and penile pain.      Objective:    BP 122/80 mmHg  Pulse 73  Temp(Src) 98.4 F (36.9 C) (Oral)  Resp 14  Ht 6\' 1"  (1.854 m)  Wt 216 lb (97.977 kg)  BMI 28.50 kg/m2  SpO2 98% Nursing note and vital signs reviewed.  Physical Exam  Constitutional: He is oriented to person, place, and time. He appears well-developed and well-nourished. No distress.  Cardiovascular: Normal rate, regular rhythm, normal heart sounds and intact distal pulses.   Pulmonary/Chest: Effort normal and breath sounds normal.  Neurological: He is alert and oriented to person, place, and time.  Skin: Skin is warm and dry.  Elevated pink, annular lesion, sporadically distributed around both sides of his scrotum. No masses or tenderness.  Psychiatric: He has a normal mood and affect. His behavior is normal. Judgment and thought content normal.       Assessment & Plan:   Problem List Items Addressed This  Visit      Musculoskeletal and Integument   Rash and nonspecific skin eruption - Primary    Itchy rash with raised lesion most likely allergic but cannot rule out fungal. Does not appear to be STI related. Start triamcinolone. Follow up if symptoms worsen or do not improve.       Relevant Medications   triamcinolone (KENALOG) 0.025 % ointment       I have discontinued Douglas Bartlett's diclofenac and cyclobenzaprine. I am also having him start on triamcinolone.   Meds ordered this encounter  Medications  . triamcinolone (KENALOG) 0.025 % ointment    Sig: Apply 1 application topically 2 (two) times daily.    Dispense:  30 g    Refill:  0    Order Specific Question:  Supervising Provider    Answer:  Hillard DankerRAWFORD, ELIZABETH A [[id number]]     Follow-up: Return if symptoms worsen or fail to improve.  Jeanine Luzalone, Sarahmarie Leavey, FNP

## 2015-06-05 ENCOUNTER — Encounter: Payer: Self-pay | Admitting: Family

## 2015-06-05 ENCOUNTER — Other Ambulatory Visit: Payer: Managed Care, Other (non HMO)

## 2015-06-05 ENCOUNTER — Ambulatory Visit (INDEPENDENT_AMBULATORY_CARE_PROVIDER_SITE_OTHER): Payer: Managed Care, Other (non HMO) | Admitting: Family

## 2015-06-05 VITALS — BP 112/80 | HR 75 | Temp 99.1°F | Resp 16 | Ht 73.0 in | Wt 218.0 lb

## 2015-06-05 DIAGNOSIS — R21 Rash and other nonspecific skin eruption: Secondary | ICD-10-CM

## 2015-06-05 MED ORDER — PREDNISONE 20 MG PO TABS: 40.0000 mg | ORAL_TABLET | Freq: Every day | ORAL | Status: DC

## 2015-06-05 MED ORDER — DOXYCYCLINE HYCLATE 100 MG PO TABS
100.0000 mg | ORAL_TABLET | Freq: Two times a day (BID) | ORAL | Status: DC
Start: 2015-06-05 — End: 2015-07-30

## 2015-06-05 NOTE — Assessment & Plan Note (Signed)
Spread of rash with concern for syphilis. Obtain RPR, HSV 1, HSV 2, and HIV. Start doxycycline and prednisone. Referral to dermatology if symptoms do not improve or worsen.

## 2015-06-05 NOTE — Progress Notes (Signed)
Pre visit review using our clinic review tool, if applicable. No additional management support is needed unless otherwise documented below in the visit note. 

## 2015-06-05 NOTE — Progress Notes (Signed)
Subjective:    Patient ID: Douglas Bartlett, male    DOB: [birth year], 25 y.o.   MRN: [id number]  Chief Complaint  Patient presents with  . Follow-up    still has issues with the rash and now the rash is spreading to hands and feet.    HPI:  Douglas Bartlett is a 25 y.o. male who  has a past medical history of Chicken pox. and presents today for a follow up office visit.   Continues to experience the associated symptom of a rash located on his scrotom that has spread to his penis and now the palms of his hands. Spots on scrotum and penis are itchy. Modifying factors include triamcinalone cream which may have helped initially. Denies fevers.  No Known Allergies   Current Outpatient Prescriptions on File Prior to Visit  Medication Sig Dispense Refill  . triamcinolone (KENALOG) 0.025 % ointment Apply 1 application topically 2 (two) times daily. 30 g 0   No current facility-administered medications on file prior to visit.    Review of Systems  Constitutional: Negative for fever and chills.  Genitourinary: Negative for dysuria, frequency, hematuria, discharge, penile swelling, scrotal swelling, penile pain and testicular pain.  Skin: Positive for rash.      Objective:    BP 112/80 mmHg  Pulse 75  Temp(Src) 99.1 F (37.3 C) (Oral)  Resp 16  Ht  (1.854 m)  Wt 218 lb (98.884 kg)  BMI 28.77 kg/m2  SpO2 98% Nursing note and vital signs reviewed.  Physical Exam  Constitutional: He is oriented to person, place, and time. He appears well-developed and well-nourished. No distress.  Cardiovascular: Normal rate, regular rhythm, normal heart sounds and intact distal pulses.   Pulmonary/Chest: Effort normal and breath sounds normal.  Neurological: He is alert and oriented to person, place, and time.  Skin: Skin is warm and dry. Rash (Circular/annular lesions sporacdic, slightly darker than skin color, firm and slightly elevated located in hands, feet and  scrotum. ) noted.    Psychiatric: He has a normal mood and affect. His behavior is normal. Judgment and thought content normal.       Assessment & Plan:   Problem List Items Addressed This Visit      Musculoskeletal and Integument   Rash and nonspecific skin eruption - Primary    Spread of rash with concern for syphilis. Obtain RPR, HSV 1, HSV 2, and HIV. Start doxycycline and prednisone. Referral to dermatology if symptoms do not improve or worsen.      Relevant Medications   doxycycline (VIBRA-TABS) 100 MG tablet   predniSONE (DELTASONE) 20 MG tablet   Other Relevant Orders   RPR   HIV antibody   HSV 1 antibody, IgG   HSV 2 antibody, IgG       I am having Douglas Bartlett start on doxycycline and predniSONE. I am also having him maintain his triamcinolone.   Meds ordered this encounter  Medications  . doxycycline (VIBRA-TABS) 100 MG tablet    Sig: Take 1 tablet (100 mg total) by mouth 2 (two) times daily.    Dispense:  20 tablet    Refill:  0    Order Specific Question:  Supervising Provider    Answer:  Hillard Danker A [[id number]]  . predniSONE (DELTASONE) 20 MG tablet    Sig: Take 2 tablets (40 mg total) by mouth daily with breakfast.    Dispense:  6 tablet    Refill:  0  Order Specific Question:  Supervising Provider    Answer:  Hillard DankerRAWFORD, ELIZABETH A [[id number]]     Follow-up: No Follow-up on file.  Douglas Bartlett, Douglas Skarda, FNP

## 2015-06-05 NOTE — Patient Instructions (Addendum)
Thank you for choosing [facility].  Summary/Instructions:  Your prescription(s) have been submitted to your pharmacy or been printed and provided for you. Please take as directed and contact our office if you believe you are having problem(s) with the medication(s) or have any questions.  Please stop by the lab on the basement level of the building for your blood work. Your results will be released to MyChart (or called to you) after review, usually within 72 hours after test completion. If any changes need to be made, you will be notified at that same time.  If your symptoms worsen or fail to improve, please contact our office for further instruction, or in case of emergency go directly to the emergency room at the closest medical facility.     

## 2015-06-06 LAB — HSV 1 ANTIBODY, IGG: HSV 1 GLYCOPROTEIN G AB, IGG: 36.2 {index} — AB (ref <0.90)

## 2015-06-06 LAB — HIV ANTIBODY (ROUTINE TESTING W REFLEX): HIV: NONREACTIVE

## 2015-06-06 LAB — HSV 2 ANTIBODY, IGG: HSV 2 Glycoprotein G Ab, IgG: <0.9 Index (ref <0.90)

## 2015-06-07 LAB — RPR TITER: RPR Titer: 1:16 {titer} — AB

## 2015-06-07 LAB — RPR: RPR Ser Ql: REACTIVE — AB

## 2015-06-08 ENCOUNTER — Encounter: Payer: Self-pay | Admitting: Family

## 2015-06-09 LAB — FLUORESCENT TREPONEMAL AB(FTA)-IGG-BLD: Fluorescent Treponemal ABS: REACTIVE — AB

## 2015-06-24 ENCOUNTER — Other Ambulatory Visit: Payer: Self-pay | Admitting: Family

## 2015-07-25 ENCOUNTER — Ambulatory Visit (HOSPITAL_COMMUNITY)
Admission: EM | Admit: 2015-07-25 | Discharge: 2015-07-25 | Disposition: A | Discharge status: Home / Self Care | Payer: Managed Care, Other (non HMO) | Attending: Family Medicine | Admitting: Family Medicine

## 2015-07-25 ENCOUNTER — Encounter (HOSPITAL_COMMUNITY): Payer: Self-pay | Admitting: Emergency Medicine

## 2015-07-25 DIAGNOSIS — T700XXA Otitic barotrauma, initial encounter: Secondary | ICD-10-CM

## 2015-07-25 MED ORDER — HYDROCODONE-ACETAMINOPHEN 5-325 MG PO TABS: 1.0000 | ORAL_TABLET | ORAL | Status: DC | PRN

## 2015-07-25 NOTE — Discharge Instructions (Signed)
Complete the Amoxicillin you are currently taking. Take the pain meds as directed. Use Ibuprofen for less severe pain. Use Benadryl 25-50 mg every 4-6 hours over the next 2 -3 days and as needed thereafter. A heating pad may help as well. If not improving over the next 5 days you will ned to arrange follow up with your family doctor. Barotitis Media Barotitis media is inflammation of your middle ear. This occurs when the auditory tube (eustachian tube) leading from the back of your nose (nasopharynx) to your eardrum is blocked. This blockage may result from a cold, environmental allergies, or an upper respiratory infection. Unresolved barotitis media may lead to damage or hearing loss (barotrauma), which may become permanent. HOME CARE INSTRUCTIONS   Use medicines as recommended by your health care provider. Over-the-counter medicines will help unblock the canal and can help during times of air travel.  Do not put anything into your ears to clean or unplug them. Eardrops will not be helpful.  Do not swim, dive, or fly until your health care provider says it is all right to do so. If these activities are necessary, chewing gum with frequent, forceful swallowing may help. It is also helpful to hold your nose and gently blow to pop your ears for equalizing pressure changes. This forces air into the eustachian tube.  Only take over-the-counter or prescription medicines for pain, discomfort, or fever as directed by your health care provider.  A decongestant may be helpful in decongesting the middle ear and make pressure equalization easier. SEEK MEDICAL CARE IF:  You experience a serious form of dizziness in which you feel as if the room is spinning and you feel nauseated (vertigo).  Your symptoms only involve one ear. SEEK IMMEDIATE MEDICAL CARE IF:   You develop a severe headache, dizziness, or severe ear pain.  You have bloody or pus-like drainage from your ears.  You develop a fever.  Your  problems do not improve or become worse. MAKE SURE YOU:   Understand these instructions.  Will watch your condition.  Will get help right away if you are not doing well or get worse.   This information is not intended to replace advice given to you by your health care provider. Make sure you discuss any questions you have with your health care provider.   Document Released: 12/19/1999 Document Revised: 10/11/2012 Document Reviewed: 07/18/2012 Elsevier Interactive Patient Education [employer]2016 [employer] Inc.

## 2015-07-25 NOTE — ED Notes (Signed)
Pt. Stated, I've had an ear ache and sore throat for 2 days. Better today

## 2015-07-26 NOTE — ED Provider Notes (Signed)
CSN: [id number]     Arrival date & time 07/25/15  1718 History   First MD Initiated Contact with Patient 07/25/15 1815     Chief Complaint  Patient presents with  . Otalgia  . Sore Throat    Patient is a 25 y.o. male presenting with ear pain and pharyngitis.  Otalgia Location:  Bilateral Severity:  Moderate Onset quality:  Gradual Duration:  3 days Timing:  Constant Progression:  Improving Chronicity:  New Context: not direct blow, not elevation change, not foreign body in ear, not loud noise and no water in ear   Relieved by:  Nothing Worsened by:  Swallowing Associated symptoms: sore throat   Associated symptoms: no abdominal pain, no congestion, no cough, no ear discharge, no fever, no hearing loss, no neck pain, no rash, no rhinorrhea, no tinnitus and no vomiting   Sore Throat Pertinent negatives include no abdominal pain.  Pt reports 3 day h/o bil ear pain R > L that is worse when he swallows. Denies recent URI. States his mother had a complete Rx for Amoxicillin that she never took so he has been taking as directed since onset of symptoms. Last swimming approx 2 weeks ago.   Past Medical History  Diagnosis Date  . Chicken pox    Past Surgical History  Procedure Laterality Date  . Tonsillectomy     Family History  Problem Relation Age of Onset  . Hypertension Mother   . Healthy Father    Social History  Substance Use Topics  . Smoking status: Former Smoker    Types: Cigarettes    Quit date: 01/05/2011  . Smokeless tobacco: Never Used  . Alcohol Use: 6.0 oz/week    10 Cans of beer per week     Comment: social    Review of Systems  Constitutional: Negative for fever.  HENT: Positive for ear pain and sore throat. Negative for congestion, ear discharge, hearing loss, rhinorrhea and tinnitus.   Respiratory: Negative for cough.   Gastrointestinal: Negative for vomiting and abdominal pain.  Musculoskeletal: Negative for neck pain.  Skin: Negative for rash.  All  other systems reviewed and are negative.   Allergies  Review of patient's allergies indicates no known allergies.  Home Medications   Prior to Admission medications   Medication Sig Start Date End Date Taking? Authorizing Provider  doxycycline (VIBRA-TABS) 100 MG tablet Take 1 tablet (100 mg total) by mouth 2 (two) times daily. 06/05/15   Veryl Speak, FNP  HYDROcodone-acetaminophen (NORCO/VICODIN) 5-325 MG tablet Take 1 tablet by mouth every 4 (four) hours as needed for moderate pain or severe pain. 07/25/15   Roma Kayser Ngoc Detjen, NP  predniSONE (DELTASONE) 20 MG tablet Take 2 tablets (40 mg total) by mouth daily with breakfast. 06/05/15   Veryl Speak, FNP  triamcinolone (KENALOG) 0.025 % ointment Apply 1 application topically 2 (two) times daily. 05/26/15   Veryl Speak, FNP  valACYclovir (VALTREX) 500 MG tablet TAKE AS DIRECTED 06/24/15   Veryl Speak, FNP   Meds Ordered and Administered this Visit  Medications - No data to display  BP 127/82 mmHg  Pulse 80  Temp(Src) 98.2 F (36.8 C) (Oral)  Resp 16  Ht 6\' 2"  (1.88 m)  Wt 218 lb (98.884 kg)  BMI 27.98 kg/m2  SpO2 98% No data found.   Physical Exam  Constitutional: He is oriented to person, place, and time. He appears well-developed and well-nourished.  HENT:  Head: Normocephalic and atraumatic.  Right Ear: Tympanic membrane is erythematous. A middle ear effusion is present.  Left Ear: Tympanic membrane, external ear and ear canal normal.  Eyes: Conjunctivae are normal.  Cardiovascular: Normal rate.   Pulmonary/Chest: Effort normal.  Neurological: He is alert and oriented to person, place, and time.  Skin: Skin is warm and dry.  Psychiatric: He has a normal mood and affect.  Nursing note and vitals reviewed.   ED Course  Procedures (including critical care time)  Labs Review Labs Reviewed - No data to display  Imaging Review No results found.   Visual Acuity Review  Right Eye Distance:   Left Eye  Distance:   Bilateral Distance:    Right Eye Near:   Left Eye Near:    Bilateral Near:         MDM   1. Barotitis media, initial encounter   Pt instructed to continue Amoxicillin since he has started at home. Recommended Benadryl PRN. Short course of Norco for pain. Pt to f/u w/ PCP if not improving over the next few days.     Leanne Chang, NP 07/26/15 1807

## 2015-07-30 ENCOUNTER — Ambulatory Visit (INDEPENDENT_AMBULATORY_CARE_PROVIDER_SITE_OTHER): Payer: Managed Care, Other (non HMO) | Admitting: Family

## 2015-07-30 ENCOUNTER — Encounter: Payer: Self-pay | Admitting: Family

## 2015-07-30 DIAGNOSIS — H6691 Otitis media, unspecified, right ear: Secondary | ICD-10-CM | POA: Diagnosis not present

## 2015-07-30 DIAGNOSIS — Z0000 Encounter for general adult medical examination without abnormal findings: Secondary | ICD-10-CM | POA: Insufficient documentation

## 2015-07-30 NOTE — Assessment & Plan Note (Signed)
Symptoms and exam consistent with resolving otitis media treated with amoxicillin by urgent care. No further evidence of infection present. Continue previously prescribed amoxicillin until completed. Follow-up if symptoms worsen or do not improve.

## 2015-07-30 NOTE — Patient Instructions (Signed)
Thank you for choosing [facility].  Summary/Instructions:  Please complete the course of amoxicillin as prescribed.  They'll contact you regarding a referral to dermatology.  Consider a second generation antihistamine such as Claritin, Allegra, Zyrtec, or Xyzal. May also add a nasal corticosteroid including Flonase, Nasacort, or Rhinocort.  If your symptoms worsen or fail to improve, please contact our office for further instruction, or in case of emergency go directly to the emergency room at the closest medical facility.

## 2015-07-30 NOTE — Progress Notes (Signed)
   Subjective:    Patient ID: Douglas Bartlett, male    DOB: January 02, 1991, 25 y.o.   MRN: [id number]  Chief Complaint  Patient presents with  . Headache    wednesday night started having a bad headache to where it caused blurred vision, now has an ear ache in right ear     HPI:  Douglas Bartlett is a 25 y.o. male who  has a past medical history of Chicken pox. and presents today for an acute office visit.  This is a new problem. Associated symptoms of headache, blurred vision, and ear discomfort going on for about on for about 1 week. Modifying factors include Mucinex and was prescribed Amoxicillin by an Urgent Care which have slowly helped with his symptoms. Describes that his ear continues to feel funny with no pain. No fevers, changes in hearing or discharge. Course of the symptoms has improved since initial onset.  No Known Allergies   Current Outpatient Prescriptions on File Prior to Visit  Medication Sig Dispense Refill  . triamcinolone (KENALOG) 0.025 % ointment Apply 1 application topically 2 (two) times daily. 30 g 0  . valACYclovir (VALTREX) 500 MG tablet TAKE AS DIRECTED 15 tablet 0   No current facility-administered medications on file prior to visit.     Review of Systems  Constitutional: Negative for chills and fever.  HENT: Negative for congestion, ear discharge and ear pain.   Neurological: Positive for headaches.      Objective:    BP 122/82 (BP Location: Left Arm, Patient Position: Sitting, Cuff Size: Normal)   Pulse 76   Temp 98.7 F (37.1 C) (Oral)   Resp 16   Ht 6\' 1"  (1.854 m)   Wt 207 lb (93.9 kg)   SpO2 98%   BMI 27.31 kg/m  Nursing note and vital signs reviewed.  Physical Exam  Constitutional: He is oriented to person, place, and time. He appears well-developed and well-nourished. No distress.  HENT:  Right Ear: Hearing, tympanic membrane, external ear and ear canal normal.  Left Ear: Hearing, tympanic membrane, external ear and ear canal normal.   Cardiovascular: Normal rate, regular rhythm, normal heart sounds and intact distal pulses.   Pulmonary/Chest: Effort normal and breath sounds normal.  Neurological: He is alert and oriented to person, place, and time.  Skin: Skin is warm and dry.  Psychiatric: He has a normal mood and affect. His behavior is normal. Judgment and thought content normal.       Assessment & Plan:   Problem List Items Addressed This Visit      Nervous and Auditory   Otitis media of right ear    Symptoms and exam consistent with resolving otitis media treated with amoxicillin by urgent care. No further evidence of infection present. Continue previously prescribed amoxicillin until completed. Follow-up if symptoms worsen or do not improve.        Other   Routine general medical examination at a health care facility   Relevant Orders   Ambulatory referral to Dermatology    Other Visit Diagnoses   None.      I have discontinued Douglas Bartlett's doxycycline, predniSONE, and HYDROcodone-acetaminophen. I am also having him maintain his triamcinolone and valACYclovir.   Follow-up: Return if symptoms worsen or fail to improve.  Jeanine Luz, FNP

## 2015-08-06 ENCOUNTER — Ambulatory Visit (HOSPITAL_COMMUNITY)
Admission: EM | Admit: 2015-08-06 | Discharge: 2015-08-06 | Disposition: A | Discharge status: Home / Self Care | Payer: Self-pay | Attending: Family Medicine | Admitting: Family Medicine

## 2015-08-06 ENCOUNTER — Encounter (HOSPITAL_COMMUNITY): Payer: Self-pay | Admitting: Emergency Medicine

## 2015-08-06 DIAGNOSIS — A084 Viral intestinal infection, unspecified: Secondary | ICD-10-CM

## 2015-08-06 LAB — POCT URINALYSIS DIP (DEVICE)
Bilirubin Urine: NEGATIVE
GLUCOSE, UA: NEGATIVE mg/dL
Hgb urine dipstick: NEGATIVE
Ketones, ur: NEGATIVE mg/dL
LEUKOCYTES UA: NEGATIVE
NITRITE: NEGATIVE
PROTEIN: 30 mg/dL — AB
Specific Gravity, Urine: 1.025 (ref 1.005–1.030)
UROBILINOGEN UA: 1 mg/dL (ref 0.0–1.0)
pH: 6 (ref 5.0–8.0)

## 2015-08-06 MED ORDER — ONDANSETRON HCL 4 MG PO TABS: 4.0000 mg | ORAL_TABLET | Freq: Four times a day (QID) | ORAL | 0 refills | Status: DC

## 2015-08-06 NOTE — ED Triage Notes (Signed)
The patient presented to the [facility] with a complaint of a low grade fever and nausea that has related lowe back pain and leg pain. He stated that he was treated 2 weeks ago for an ear infection and was prescribed amoxicillin and vicodin and when he completed the course of treatment the fever and pain returned.

## 2015-08-06 NOTE — ED Provider Notes (Signed)
[facility]    CSN: [id number] Arrival date & time: 08/06/15  1259  First Provider Contact:  First MD Initiated Contact with Patient 08/06/15 1552        History   Chief Complaint Chief Complaint  Patient presents with  . Nausea    HPI Douglas Bartlett is a 25 y.o. male.   The history is provided by the patient.  Diarrhea  Quality:  Watery Severity:  Mild Onset quality:  Gradual Duration:  3 days Progression:  Unchanged Relieved by:  None tried Worsened by:  Nothing Ineffective treatments:  None tried Associated symptoms: no abdominal pain, no fever and no vomiting     Past Medical History:  Diagnosis Date  . Chicken pox     Patient Active Problem List   Diagnosis Date Noted  . Otitis media of right ear 07/30/2015  . Routine general medical examination at a health care facility 07/30/2015  . Rash and nonspecific skin eruption 05/26/2015  . Syncopal episodes 11/12/2014    Past Surgical History:  Procedure Laterality Date  . TONSILLECTOMY         Home Medications    Prior to Admission medications   Medication Sig Start Date End Date Taking? Authorizing Provider  triamcinolone (KENALOG) 0.025 % ointment Apply 1 application topically 2 (two) times daily. 05/26/15   Veryl Speak, FNP  valACYclovir (VALTREX) 500 MG tablet TAKE AS DIRECTED 06/24/15   Veryl Speak, FNP    Family History Family History  Problem Relation Age of Onset  . Hypertension Mother   . Healthy Father     Social History Social History  Substance Use Topics  . Smoking status: Former Smoker    Types: Cigarettes    Quit date: 01/05/2011  . Smokeless tobacco: Never Used  . Alcohol use 6.0 oz/week    10 Cans of beer per week     Comment: social     Allergies   Review of patient's allergies indicates no known allergies.   Review of Systems Review of Systems  Constitutional: Negative.  Negative for fever.  HENT: Negative.  Negative for ear pain.     Respiratory: Negative.   Cardiovascular: Negative.   Gastrointestinal: Positive for diarrhea and nausea. Negative for abdominal pain, blood in stool and vomiting.  Skin: Negative.   Neurological: Negative.   All other systems reviewed and are negative.    Physical Exam Triage Vital Signs ED Triage Vitals  Enc Vitals Group     BP 08/06/15 1425 118/82     Pulse Rate 08/06/15 1425 90     Resp 08/06/15 1425 18     Temp 08/06/15 1425 100.8 F (38.2 C)     Temp Source 08/06/15 1425 Oral     SpO2 08/06/15 1425 97 %     Weight 08/06/15 1425 207 lb (93.9 kg)     Height 08/06/15 1425 6\' 1"  (1.854 m)     Head Circumference --      Peak Flow --      Pain Score 08/06/15 1432 7     Pain Loc --      Pain Edu? --      Excl. in GC? --    No data found.   Updated Vital Signs BP 118/82 (BP Location: Left Arm)   Pulse 90   Temp 100.8 F (38.2 C) (Oral)   Resp 18   Ht 6\' 1"  (1.854 m)   Wt 207 lb (93.9 kg)   SpO2  97%   BMI 27.31 kg/m   Visual Acuity Right Eye Distance:   Left Eye Distance:   Bilateral Distance:    Right Eye Near:   Left Eye Near:    Bilateral Near:     Physical Exam  Constitutional: He is oriented to person, place, and time. He appears well-developed and well-nourished.  HENT:  Right Ear: External ear normal.  Left Ear: External ear normal.  Mouth/Throat: Oropharynx is clear and moist.  Eyes: Conjunctivae and EOM are normal. Pupils are equal, round, and reactive to light.  Neck: Normal range of motion. Neck supple.  Cardiovascular: Normal rate, regular rhythm, normal heart sounds and intact distal pulses.   Pulmonary/Chest: Effort normal and breath sounds normal.  Abdominal: Soft. Bowel sounds are normal. There is no tenderness.  Musculoskeletal: He exhibits tenderness.  Lymphadenopathy:    He has no cervical adenopathy.  Neurological: He is alert and oriented to person, place, and time.  Skin: Skin is warm and dry. Capillary refill takes less than 2  seconds.  Nursing note and vitals reviewed.    UC Treatments / Results  Labs (all labs ordered are listed, but only abnormal results are displayed) Labs Reviewed  POCT URINALYSIS DIP (DEVICE) - Abnormal; Notable for the following:       Result Value   Protein, ur 30 (*)    All other components within normal limits    EKG  EKG Interpretation None       Radiology No results found.  Procedures Procedures (including critical care time)  Medications Ordered in UC Medications - No data to display   Initial Impression / Assessment and Plan / UC Course  I have reviewed the triage vital signs and the nursing notes.  Pertinent labs & imaging results that were available during my care of the patient were reviewed by me and considered in my medical decision making (see chart for details).  Clinical Course      Final Clinical Impressions(s) / UC Diagnoses   Final diagnoses:  None    New Prescriptions New Prescriptions   No medications on file     Linna Hoff, MD 08/06/15 1610

## 2015-08-11 ENCOUNTER — Encounter: Payer: Self-pay | Admitting: Family

## 2015-08-11 ENCOUNTER — Ambulatory Visit (INDEPENDENT_AMBULATORY_CARE_PROVIDER_SITE_OTHER): Payer: Managed Care, Other (non HMO) | Admitting: Family

## 2015-08-11 ENCOUNTER — Other Ambulatory Visit (INDEPENDENT_AMBULATORY_CARE_PROVIDER_SITE_OTHER): Payer: Managed Care, Other (non HMO)

## 2015-08-11 VITALS — BP 114/86 | HR 109 | Temp 98.3°F | Resp 16 | Ht 73.0 in | Wt 200.0 lb

## 2015-08-11 DIAGNOSIS — A084 Viral intestinal infection, unspecified: Secondary | ICD-10-CM | POA: Diagnosis not present

## 2015-08-11 LAB — CBC
HCT: 47.3 % (ref 39.0–52.0)
Hemoglobin: 15.8 g/dL (ref 13.0–17.0)
MCHC: 33.4 g/dL (ref 30.0–36.0)
MCV: 85.3 fl (ref 78.0–100.0)
PLATELETS: 226 10*3/uL (ref 150.0–400.0)
RBC: 5.54 Mil/uL (ref 4.22–5.81)
RDW: 14.4 % (ref 11.5–15.5)
WBC: 5.8 10*3/uL (ref 4.0–10.5)

## 2015-08-11 LAB — COMPREHENSIVE METABOLIC PANEL
ALBUMIN: 4.4 g/dL (ref 3.5–5.2)
ALK PHOS: 47 U/L (ref 39–117)
ALT: 29 U/L (ref 0–53)
AST: 25 U/L (ref 0–37)
BILIRUBIN TOTAL: 0.7 mg/dL (ref 0.2–1.2)
BUN: 14 mg/dL (ref 6–23)
CALCIUM: 9.9 mg/dL (ref 8.4–10.5)
CO2: 27 mEq/L (ref 19–32)
CREATININE: 1.1 mg/dL (ref 0.40–1.50)
Chloride: 104 mEq/L (ref 96–112)
GFR: 104.45 mL/min (ref >60.00)
Glucose, Bld: 112 mg/dL — ABNORMAL HIGH (ref 70–99)
Potassium: 4.1 mEq/L (ref 3.5–5.1)
Sodium: 138 mEq/L (ref 135–145)
TOTAL PROTEIN: 8.3 g/dL (ref 6.0–8.3)

## 2015-08-11 NOTE — Progress Notes (Signed)
Subjective:    Patient ID: Douglas Bartlett, male    DOB: August 31, 1990, 25 y.o.   MRN: [id number]  Chief Complaint  Patient presents with  . Stomach issues    x3 weeks, has been having nausea and back pain, has been very tired, loss of appetite, started eating a little bit on friday and even when he ate then he had some pain in his stomach, has alos had diarrhea    HPI:  Douglas Bartlett is a 24 y.o. male who  has a past medical history of Chicken pox. and presents today for an acute office visit.    This is a new problem. Recently evaluated in urgent care with the chief complaint of nausea and diarrhea for 3 days prior to presentation. At the time x-rays no abdominal pain, fever, or vomiting. Abdominal exam is benign. He was diagnosed with viral gastroenteritis and given a prescription for ondansetron. Since being evaluated in urgent care he notes significant improvements in his symptoms with improvements in his nausea and appetite. He does continue to experience the associated symptom of occasional "twinges" located in his epigastric region described as sharp. Describes the pain for a couple seconds and then it goes away. Modifying factors include the ondansetron which she reports taken as prescribed and denies adverse side effects although has not needed many doses. Has resumed normal bowel movement patterns and his intake is gradually improving. Does note additional fatigue more than normal. No fevers or additional symptoms.   No Known Allergies   Outpatient Medications Prior to Visit  Medication Sig Dispense Refill  . ondansetron (ZOFRAN) 4 MG tablet Take 1 tablet (4 mg total) by mouth every 6 (six) hours. Prn n/v. 12 tablet 0  . triamcinolone (KENALOG) 0.025 % ointment Apply 1 application topically 2 (two) times daily. 30 g 0  . valACYclovir (VALTREX) 500 MG tablet TAKE AS DIRECTED 15 tablet 0   No facility-administered medications prior to visit.      Review of Systems    Constitutional: Negative for chills and fever.  Respiratory: Negative for cough, chest tightness and wheezing.   Cardiovascular: Negative for chest pain, palpitations and leg swelling.  Gastrointestinal: Positive for abdominal pain. Negative for blood in stool, constipation, diarrhea, nausea and vomiting.      Objective:    BP 114/86 (BP Location: Left Arm, Patient Position: Sitting, Cuff Size: Normal)   Pulse (!) 109   Temp 98.3 F (36.8 C) (Oral)   Resp 16   Ht '6\' 1"'  (1.854 m)   Wt 200 lb (90.7 kg)   SpO2 97%   BMI 26.39 kg/m  Nursing note and vital signs reviewed.  Physical Exam  Constitutional: He is oriented to person, place, and time. He appears well-developed and well-nourished. No distress.  Cardiovascular: Normal rate, regular rhythm, normal heart sounds and intact distal pulses.   Pulmonary/Chest: Effort normal and breath sounds normal.  Abdominal: Normal appearance and bowel sounds are normal. He exhibits no distension and no mass. There is no hepatosplenomegaly. There is tenderness in the epigastric area. There is no rigidity, no rebound, no guarding, no tenderness at McBurney's point and negative Murphy's sign.  Neurological: He is alert and oriented to person, place, and time.  Skin: Skin is warm and dry.  Psychiatric: He has a normal mood and affect. His behavior is normal. Judgment and thought content normal.       Assessment & Plan:   Problem List Items Addressed This Visit  Digestive   Viral gastroenteritis - Primary    Symptoms and exam consistent with resolving gastroenteritis although there is mild concern for possible ulcer although unlikely given symptom improvement. Recommend over-the-counter probiotic. Advance diet as tolerated. Obtain CBC and complete met about panel. Follow-up if symptoms worsen or do not improve for possible imaging or additional blood work.      Relevant Orders   Comprehensive metabolic panel (Completed)   CBC (Completed)     Other Visit Diagnoses   None.      I have discontinued Mr. Weissinger's triamcinolone, valACYclovir, and ondansetron.   Follow-up: Return if symptoms worsen or fail to improve.  Mauricio Po, FNP

## 2015-08-11 NOTE — Patient Instructions (Signed)
Thank you for choosing [facility].  Summary/Instructions:  Please continue to advance your diet as tolerated.   Probiotic like Align, IBGuard, Culturelle, or Activia  Please stop by the lab on the lower level of the building for your blood work. Your results will be released to MyChart (or called to you) after review, usually within 72 hours after test completion. If any changes need to be made, you will be notified at that same time.  1. The lab is open from 7:30am to 5:30 pm Monday-Friday  2. No appointment is necessary  3. Fasting (if needed) is 6-8 hours after food and drink; black  coffee and water are okay   If your symptoms worsen or fail to improve, please contact our office for further instruction, or in case of emergency go directly to the emergency room at the closest medical facility.

## 2015-08-11 NOTE — Assessment & Plan Note (Signed)
Symptoms and exam consistent with resolving gastroenteritis although there is mild concern for possible ulcer although unlikely given symptom improvement. Recommend over-the-counter probiotic. Advance diet as tolerated. Obtain CBC and complete met about panel. Follow-up if symptoms worsen or do not improve for possible imaging or additional blood work.

## 2015-08-12 ENCOUNTER — Encounter: Payer: Self-pay | Admitting: Family

## 2015-10-07 ENCOUNTER — Telehealth: Payer: Self-pay

## 2015-10-07 NOTE — Telephone Encounter (Signed)
State health department called and stated that patient had a positive STD test on June 1st 2017 they are needing to know what his CC was on the office visit and if the patient had any signs or symptoms as well as if he was treated for this or not.   Please call Elfredia NevinsCorey Barr back at [phone]

## 2015-10-07 NOTE — Telephone Encounter (Signed)
Called state health department back with info.

## 2016-01-06 ENCOUNTER — Other Ambulatory Visit (INDEPENDENT_AMBULATORY_CARE_PROVIDER_SITE_OTHER): Payer: Managed Care, Other (non HMO)

## 2016-01-06 ENCOUNTER — Ambulatory Visit (INDEPENDENT_AMBULATORY_CARE_PROVIDER_SITE_OTHER): Payer: Managed Care, Other (non HMO) | Admitting: Family

## 2016-01-06 ENCOUNTER — Encounter: Payer: Self-pay | Admitting: Family

## 2016-01-06 VITALS — BP 114/70 | HR 77 | Temp 98.2°F | Resp 16 | Ht 73.0 in | Wt 200.8 lb

## 2016-01-06 DIAGNOSIS — Z0000 Encounter for general adult medical examination without abnormal findings: Secondary | ICD-10-CM | POA: Diagnosis not present

## 2016-01-06 LAB — CBC
HCT: 46.9 % (ref 39.0–52.0)
Hemoglobin: 16.1 g/dL (ref 13.0–17.0)
MCHC: 34.4 g/dL (ref 30.0–36.0)
MCV: 86.1 fl (ref 78.0–100.0)
PLATELETS: 203 10*3/uL (ref 150.0–400.0)
RBC: 5.45 Mil/uL (ref 4.22–5.81)
RDW: 13.2 % (ref 11.5–15.5)
WBC: 3.7 10*3/uL — ABNORMAL LOW (ref 4.0–10.5)

## 2016-01-06 LAB — LIPID PANEL
CHOL/HDL RATIO: 4
Cholesterol: 127 mg/dL (ref 0–200)
HDL: 35.4 mg/dL — ABNORMAL LOW (ref >39.00)
LDL CALC: 64 mg/dL (ref 0–99)
NONHDL: 91.72
Triglycerides: 139 mg/dL (ref 0.0–149.0)
VLDL: 27.8 mg/dL (ref 0.0–40.0)

## 2016-01-06 LAB — COMPREHENSIVE METABOLIC PANEL
ALT: 25 U/L (ref 0–53)
AST: 20 U/L (ref 0–37)
Albumin: 4.3 g/dL (ref 3.5–5.2)
Alkaline Phosphatase: 62 U/L (ref 39–117)
BILIRUBIN TOTAL: 0.7 mg/dL (ref 0.2–1.2)
BUN: 12 mg/dL (ref 6–23)
CALCIUM: 9.6 mg/dL (ref 8.4–10.5)
CHLORIDE: 100 meq/L (ref 96–112)
CO2: 29 meq/L (ref 19–32)
CREATININE: 1.03 mg/dL (ref 0.40–1.50)
GFR: 112.32 mL/min (ref >60.00)
GLUCOSE: 94 mg/dL (ref 70–99)
Potassium: 4 mEq/L (ref 3.5–5.1)
SODIUM: 136 meq/L (ref 135–145)
Total Protein: 8.2 g/dL (ref 6.0–8.3)

## 2016-01-06 LAB — RPR

## 2016-01-06 NOTE — Assessment & Plan Note (Signed)
1) Anticipatory Guidance: Discussed importance of wearing a seatbelt while driving and not texting while driving; changing batteries in smoke detector at least once annually; wearing suntan lotion when outside; eating a balanced and moderate diet; getting physical activity at least 30 minutes per day.  2) Immunizations / Screenings / Labs:  All immunizations are up-to-date per recommendations. Due for a vision screen encouraged to be completed independently. Patient requests STD screening with orders placed for GC/chlamydia probe, HIV, HSV 2, and RPR. All other screenings are up-to-date per recommendations. Obtain CBC, CMET, and lipid profile.    Overall well exam with minimal risk factors for cardiovascular disease present. He is of adequate weight and exercises regularly through work. 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 Has good aspiration goals established for himself. Continue healthy lifestyle behaviors and choices. Follow-up prevention exam in 1-2 years pending blood work and office visit as needed or pending blood work results.

## 2016-01-06 NOTE — Progress Notes (Signed)
Subjective:    Patient ID: Douglas Bartlett, male    DOB: [birth year], 26 y.o.   MRN: [id number]  Chief Complaint  Patient presents with  . CPE    not fasting    HPI:  Douglas Bartlett is a 26 y.o. male who presents today for an annual wellness visit.   1) Health Maintenance -   Diet - Averages about 3 meals per day consisting of regular diet; Caffeine intake of about 1-2 cups most days of the week.   Exercise - No structured exercise; does stretch at work prior to starting   2) Preventative Exams / Immunizations:  Dental -- Up to date  Vision -- Due for exam   Health Maintenance  Topic Date Due  . TETANUS/TDAP  06/14/2024  . INFLUENZA VACCINE  Completed  . HIV Screening  Completed    Immunization History  Administered Date(s) Administered  . Influenza-Unspecified 10/05/2014  . Tdap 06/15/2014     No Known Allergies   No outpatient prescriptions prior to visit.   No facility-administered medications prior to visit.      Past Medical History:  Diagnosis Date  . Chicken pox      Past Surgical History:  Procedure Laterality Date  . TONSILLECTOMY       Family History  Problem Relation Age of Onset  . Hypertension Mother   . Healthy Father      Social History   Social History  . Marital status: Single    Spouse name: N/A  . Number of children: 0  . Years of education: 12   Occupational History  . [employer]    Social History Main Topics  . Smoking status: Former Smoker    Types: Cigarettes    Quit date: 01/05/2011  . Smokeless tobacco: Never Used  . Alcohol use Yes     Comment: ~ 10 liquor drinks per year  . Drug use: No  . Sexual activity: Not on file   Other Topics Concern  . Not on file   Social History Narrative   Fun: Sherri Rad out with friends   Denies religious beliefs effecting health care.      Review of Systems  Constitutional: Denies fever, chills, fatigue, or significant weight gain/loss. HENT: Head: Denies  headache or neck pain Ears: Denies changes in hearing, ringing in ears, earache, drainage Nose: Denies discharge, stuffiness, itching, nosebleed, sinus pain Throat: Denies sore throat, hoarseness, dry mouth, sores, thrush Eyes: Denies loss/changes in vision, pain, redness, blurry/double vision, flashing lights Cardiovascular: Denies chest pain/discomfort, tightness, palpitations, shortness of breath with activity, difficulty lying down, swelling, sudden awakening with shortness of breath Respiratory: Denies shortness of breath, cough, sputum production, wheezing Gastrointestinal: Denies dysphasia, heartburn, change in appetite, nausea, change in bowel habits, rectal bleeding, constipation, diarrhea, yellow skin or eyes Genitourinary: Denies frequency, urgency, burning/pain, blood in urine, incontinence, change in urinary strength. Musculoskeletal: Denies muscle/joint pain, stiffness, back pain, redness or swelling of joints, trauma Skin: Denies rashes, lumps, itching, dryness, color changes, or hair/nail changes Neurological: Denies dizziness, fainting, seizures, weakness, numbness, tingling, tremor Psychiatric - Denies nervousness, stress, depression or memory loss Endocrine: Denies heat or cold intolerance, sweating, frequent urination, excessive thirst, changes in appetite Hematologic: Denies ease of bruising or bleeding     Objective:     BP 114/70 (BP Location: Left Arm, Patient Position: Sitting, Cuff Size: Normal)   Pulse 77   Temp 98.2 F (36.8 C) (Oral)   Resp 16   Ht 6\' 1"  (  1.854 m)   Wt 200 lb 12.8 oz (91.1 kg)   SpO2 98%   BMI 26.49 kg/m  Nursing note and vital signs reviewed.  Physical Exam  Constitutional: He is oriented to person, place, and time. He appears well-developed and well-nourished.  HENT:  Head: Normocephalic.  Right Ear: Hearing, tympanic membrane, external ear and ear canal normal.  Left Ear: Hearing, tympanic membrane, external ear and ear canal  normal.  Nose: Nose normal.  Mouth/Throat: Uvula is midline, oropharynx is clear and moist and mucous membranes are normal.  Eyes: Conjunctivae and EOM are normal. Pupils are equal, round, and reactive to light.  Neck: Neck supple. No JVD present. No tracheal deviation present. No thyromegaly present.  Cardiovascular: Normal rate, regular rhythm, normal heart sounds and intact distal pulses.   Pulmonary/Chest: Effort normal and breath sounds normal.  Abdominal: Soft. Bowel sounds are normal. He exhibits no distension and no mass. There is no tenderness. There is no rebound and no guarding.  Musculoskeletal: Normal range of motion. He exhibits no edema or tenderness.  Lymphadenopathy:    He has no cervical adenopathy.  Neurological: He is alert and oriented to person, place, and time. He has normal reflexes. No cranial nerve deficit. He exhibits normal muscle tone. Coordination normal.  Skin: Skin is warm and dry.  Psychiatric: He has a normal mood and affect. His behavior is normal. Judgment and thought content normal.       Assessment & Plan:   Problem List Items Addressed This Visit      Other   Routine general medical examination at a health care facility - Primary    1) Anticipatory Guidance: Discussed importance of wearing a seatbelt while driving and not texting while driving; changing batteries in smoke detector at least once annually; wearing suntan lotion when outside; eating a balanced and moderate diet; getting physical activity at least 30 minutes per day.  2) Immunizations / Screenings / Labs:  All immunizations are up-to-date per recommendations. Due for a vision screen encouraged to be completed independently. Patient requests STD screening with orders placed for GC/chlamydia probe, HIV, HSV 2, and RPR. All other screenings are up-to-date per recommendations. Obtain CBC, CMET, and lipid profile.    Overall well exam with minimal risk factors for cardiovascular disease  present. He is of adequate weight and exercises regularly through work. 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 Has good aspiration goals established for himself. Continue healthy lifestyle behaviors and choices. Follow-up prevention exam in 1-2 years pending blood work and office visit as needed or pending blood work results.       Relevant Orders   CBC (Completed)   Comprehensive metabolic panel (Completed)   Lipid panel (Completed)   HIV antibody (Completed)   HSV 2 antibody, IgG (Completed)   RPR (Completed)   GC/chlamydia probe amp, urine      Follow-up: Return if symptoms worsen or fail to improve.   Jeanine Luzalone, Mandrell Vangilder, FNP

## 2016-01-06 NOTE — Patient Instructions (Signed)
Thank you for choosing [facility].  SUMMARY AND INSTRUCTIONS:   Labs:  Please stop by the lab on the lower level of the building for your blood work. Your results will be released to MyChart (or called to you) after review, usually within 72 hours after test completion. If any changes need to be made, you will be notified at that same time.  1.) The lab is open from 7:30am to 5:30 pm Monday-Friday 2.) No appointment is necessary 3.) Fasting (if needed) is 6-8 hours after food and drink; black coffee and water are okay    Health Maintenance, Male A healthy lifestyle and preventative care can promote health and wellness.  Maintain regular health, dental, and eye exams.  Eat a healthy diet. Foods like vegetables, fruits, whole grains, low-fat dairy products, and lean protein foods contain the nutrients you need and are low in calories. Decrease your intake of foods high in solid fats, added sugars, and salt. Get information about a proper diet from your health care provider, if necessary.  Regular physical exercise is one of the most important things you can do for your health. Most adults should get at least 150 minutes of moderate-intensity exercise (any activity that increases your heart rate and causes you to sweat) each week. In addition, most adults need muscle-strengthening exercises on 2 or more days a week.   Maintain a healthy weight. The body mass index (BMI) is a screening tool to identify possible weight problems. It provides an estimate of body fat based on height and weight. Your health care provider can find your BMI and can help you achieve or maintain a healthy weight. For males 20 years and older:  A BMI below 18.5 is considered underweight.  A BMI of 18.5 to 24.9 is normal.  A BMI of 25 to 29.9 is considered overweight.  A BMI of 30 and above is considered obese.  Maintain normal blood lipids and cholesterol by exercising and minimizing your intake of  saturated fat. Eat a balanced diet with plenty of fruits and vegetables. Blood tests for lipids and cholesterol should begin at age 78 and be repeated every 5 years. If your lipid or cholesterol levels are high, you are over age 93, or you are at high risk for heart disease, you may need your cholesterol levels checked more frequently.Ongoing high lipid and cholesterol levels should be treated with medicines if diet and exercise are not working.  If you smoke, find out from your health care provider how to quit. If you do not use tobacco, do not start.  Lung cancer screening is recommended for adults aged 55-80 years who are at high risk for developing lung cancer because of a history of smoking. A yearly low-dose CT scan of the lungs is recommended for people who have at least a 30-pack-year history of smoking and are current smokers or have quit within the past 15 years. A pack year of smoking is smoking an average of 1 pack of cigarettes a day for 1 year (for example, a 30-pack-year history of smoking could mean smoking 1 pack a day for 30 years or 2 packs a day for 15 years). Yearly screening should continue until the smoker has stopped smoking for at least 15 years. Yearly screening should be stopped for people who develop a health problem that would prevent them from having lung cancer treatment.  If you choose to drink alcohol, do not have more than 2 drinks per day. One drink is  considered to be 12 oz (360 mL) of beer, 5 oz (150 mL) of wine, or 1.5 oz (45 mL) of liquor.  Avoid the use of street drugs. Do not share needles with anyone. Ask for help if you need support or instructions about stopping the use of drugs.  High blood pressure causes heart disease and increases the risk of stroke. High blood pressure is more likely to develop in:  People who have blood pressure in the end of the normal range (100-139/85-89 mm Hg).  People who are overweight or obese.  People who are African  American.  If you are 4118-26 years of age, have your blood pressure checked every 3-5 years. If you are 26 years of age or older, have your blood pressure checked every year. You should have your blood pressure measured twice-once when you are at a hospital or clinic, and once when you are not at a hospital or clinic. Record the average of the two measurements. To check your blood pressure when you are not at a hospital or clinic, you can use:  An automated blood pressure machine at a pharmacy.  A home blood pressure monitor.  If you are 2945-26 years old, ask your health care provider if you should take aspirin to prevent heart disease.  Diabetes screening involves taking a blood sample to check your fasting blood sugar level. This should be done once every 3 years after age 26 if you are at a normal weight and without risk factors for diabetes. Testing should be considered at a younger age or be carried out more frequently if you are overweight and have at least 1 risk factor for diabetes.  Colorectal cancer can be detected and often prevented. Most routine colorectal cancer screening begins at the age of 26 and continues through age 26. However, your health care provider may recommend screening at an earlier age if you have risk factors for colon cancer. On a yearly basis, your health care provider may provide home test kits to check for hidden blood in the stool. A small camera at the end of a tube may be used to directly examine the colon (sigmoidoscopy or colonoscopy) to detect the earliest forms of colorectal cancer. Talk to your health care provider about this at age 26 when routine screening begins. A direct exam of the colon should be repeated every 5-10 years through age 26, unless early forms of precancerous polyps or small growths are found.  People who are at an increased risk for hepatitis B should be screened for this virus. You are considered at high risk for hepatitis B if:  You were  born in a country where hepatitis B occurs often. Talk with your health care provider about which countries are considered high risk.  Your parents were born in a high-risk country and you have not received a shot to protect against hepatitis B (hepatitis B vaccine).  You have HIV or AIDS.  You use needles to inject street drugs.  You live with, or have sex with, someone who has hepatitis B.  You are a man who has sex with other men (MSM).  You get hemodialysis treatment.  You take certain medicines for conditions like cancer, organ transplantation, and autoimmune conditions.  Hepatitis C blood testing is recommended for all people born from [birth year] through 1965 and any individual with known risk factors for hepatitis C.  Healthy men should no longer receive prostate-specific antigen (PSA) blood tests as part of routine cancer screening.  Talk to your health care provider about prostate cancer screening.  Testicular cancer screening is not recommended for adolescents or adult males who have no symptoms. Screening includes self-exam, a health care provider exam, and other screening tests. Consult with your health care provider about any symptoms you have or any concerns you have about testicular cancer.  Practice safe sex. Use condoms and avoid high-risk sexual practices to reduce the spread of sexually transmitted infections (STIs).  You should be screened for STIs, including gonorrhea and chlamydia if:  You are sexually active and are younger than 24 years.  You are older than 24 years, and your health care provider tells you that you are at risk for this type of infection.  Your sexual activity has changed since you were last screened, and you are at an increased risk for chlamydia or gonorrhea. Ask your health care provider if you are at risk.  If you are at risk of being infected with HIV, it is recommended that you take a prescription medicine daily to prevent HIV infection. This is  called pre-exposure prophylaxis (PrEP). You are considered at risk if:  You are a man who has sex with other men (MSM).  You are a heterosexual man who is sexually active with multiple partners.  You take drugs by injection.  You are sexually active with a partner who has HIV.  Talk with your health care provider about whether you are at high risk of being infected with HIV. If you choose to begin PrEP, you should first be tested for HIV. You should then be tested every 3 months for as long as you are taking PrEP.  Use sunscreen. Apply sunscreen liberally and repeatedly throughout the day. You should seek shade when your shadow is shorter than you. Protect yourself by wearing long sleeves, pants, a wide-brimmed hat, and sunglasses year round whenever you are outdoors.  Tell your health care provider of new moles or changes in moles, especially if there is a change in shape or color. Also, tell your health care provider if a mole is larger than the size of a pencil eraser.  A one-time screening for abdominal aortic aneurysm (AAA) and surgical repair of large AAAs by ultrasound is recommended for men aged 65-75 years who are current or former smokers.  Stay current with your vaccines (immunizations). This information is not intended to replace advice given to you by your health care provider. Make sure you discuss any questions you have with your health care provider. Document Released: 06/19/2007 Document Revised: 01/11/2014 Document Reviewed: 09/24/2014 Elsevier Interactive Patient Education  2017 [employer].

## 2016-01-07 ENCOUNTER — Telehealth: Payer: Self-pay | Admitting: Infectious Disease

## 2016-01-07 LAB — GC/CHLAMYDIA PROBE AMP
CT Probe RNA: NOT DETECTED
GC PROBE AMP APTIMA: NOT DETECTED

## 2016-01-07 LAB — HSV 2 ANTIBODY, IGG

## 2016-01-07 LAB — HIV 1/2 CONFIRMATION
HIV 1 ANTIBODY: POSITIVE — AB
HIV-2 Ab: NEGATIVE

## 2016-01-07 LAB — HIV ANTIBODY (ROUTINE TESTING W REFLEX): HIV 1&2 Ab, 4th Generation: REACTIVE — AB

## 2016-01-07 NOTE — Telephone Encounter (Signed)
I was flagged via EP IC that this patient has a positive HIV test.  Since you did the testing I didn't want to get in the way view informing him of the results but a few not comfortable discussing them with him we certainly can.  In the interim we can certainly work on getting him into our clinic and getting proper testing done and starting him on antiretroviral therapy.  I will cc my nurse

## 2016-01-08 ENCOUNTER — Telehealth: Payer: Self-pay | Admitting: Family

## 2016-01-08 NOTE — Telephone Encounter (Signed)
Patient returned call and results were given and patient's questions were answered. Infectious disease already aware and will work to schedule. Referral can be placed if necessary.

## 2016-01-08 NOTE — Telephone Encounter (Signed)
Thank you so much we will be happy to get him into clinic once you have touched base with him.  We can also send a disease interventional specialist to see him from the state they will be visiting him in the future regardless. I think though if he has a therapeutic relationship with you he would benefit from being told first by you

## 2016-01-08 NOTE — Telephone Encounter (Signed)
Thank you for the assistance it is greatly appreciated. I attempted to reach him this morning and left a voicemail for him to call back. Will attempt again.

## 2016-01-08 NOTE — Telephone Encounter (Signed)
Attempted to reach patient to discuss results and left voicemail message for patient to return call.

## 2016-01-15 ENCOUNTER — Encounter: Payer: Self-pay | Admitting: Internal Medicine

## 2016-01-15 ENCOUNTER — Ambulatory Visit (INDEPENDENT_AMBULATORY_CARE_PROVIDER_SITE_OTHER): Payer: Managed Care, Other (non HMO) | Admitting: Internal Medicine

## 2016-01-15 VITALS — BP 153/91 | HR 85 | Temp 98.0°F | Ht 73.5 in | Wt 199.0 lb

## 2016-01-15 DIAGNOSIS — Z23 Encounter for immunization: Secondary | ICD-10-CM | POA: Diagnosis not present

## 2016-01-15 DIAGNOSIS — B20 Human immunodeficiency virus [HIV] disease: Secondary | ICD-10-CM

## 2016-01-15 DIAGNOSIS — Z21 Asymptomatic human immunodeficiency virus [HIV] infection status: Secondary | ICD-10-CM | POA: Insufficient documentation

## 2016-01-15 LAB — LIPID PANEL
Cholesterol: 125 mg/dL (ref <200)
HDL: 36 mg/dL — ABNORMAL LOW (ref >40)
LDL CALC: 65 mg/dL (ref <100)
TRIGLYCERIDES: 122 mg/dL (ref <150)
Total CHOL/HDL Ratio: 3.5 Ratio (ref <5.0)
VLDL: 24 mg/dL (ref <30)

## 2016-01-15 LAB — HEPATITIS B SURFACE ANTIBODY,QUALITATIVE: HEP B S AB: POSITIVE — AB

## 2016-01-15 LAB — HEPATITIS A ANTIBODY, TOTAL: Hep A Total Ab: NONREACTIVE

## 2016-01-15 LAB — HEPATITIS B SURFACE ANTIGEN: Hepatitis B Surface Ag: NEGATIVE

## 2016-01-15 LAB — HEPATITIS B CORE ANTIBODY, TOTAL: Hep B Core Total Ab: NONREACTIVE

## 2016-01-15 MED ORDER — MENINGOCOCCAL A C Y&W-135 OLIG IM SOLR
0.5000 mL | Freq: Once | INTRAMUSCULAR | Status: AC
  Administered 2016-01-15: 0.5 mL via INTRAMUSCULAR

## 2016-01-15 NOTE — Telephone Encounter (Signed)
Left message for patient to call Infectious Disease for a new patient appointment.    Laurell Josephsammy K King, RN

## 2016-01-15 NOTE — Progress Notes (Signed)
Patient ID: Douglas Bartlett, male    DOB: Dec 04, 1990, 26 y.o.   MRN: [id number]  Reason for visit: to establish care as a new patient with HIV  HPI:   Patient was first diagnosed earlier last week with a screening exam by his PCP.  He was tested as part routine screen with risk factors.  Previously tested negative in June 2017.  He endorses MSM, no CD 4 or viral load yet.  He also has a history of syphils but no GC, no chlmaydia.  He did not have any acute symptoms of lymphadenopathy, no fever or rashes.  No current penile discharge.  He is interested in treatement.    Past Medical History:  Diagnosis Date  . Chicken pox     Prior to Admission medications   Not on File    No Known Allergies  Social History  Substance Use Topics  . Smoking status: Current Some Day Smoker    Types: Cigars    Start date: 01/05/2011  . Smokeless tobacco: Never Used  . Alcohol use Yes     Comment: weekends liquor    Family History  Problem Relation Age of Onset  . Hypertension Mother   . Healthy Father   mgm with HTN No renal disease  Review of Systems Constitutional: negative for fatigue, malaise, anorexia and weight loss Respiratory: negative for cough or sputum Gastrointestinal: negative for diarrhea Genitourinary: negative for frequency and dysuria Musculoskeletal: negative for myalgias and arthralgias Neurological: negative for headaches and dizziness All other systems reviewed and are negative    CONSTITUTIONAL:in no apparent distress and alert  Vitals:   01/15/16 1619  BP: (!) 153/91  Pulse: 85  Temp: 98 F (36.7 C)   EYES: anicteric HENT: no thrush CARD:Cor RRR and No murmurs RESP:CTA B; normal respiratory effort WG:NFAOZGI:Bowel sounds are normal, liver is not enlarged, spleen is not enlarged MS:no pedal edema noted SKIN:no rashes NEURO: non-focal GU: no penile lesions, no inguinal lad, no discharge  No results found for: HIV1RNAQUANT No components found for: HIV1GENOTYPRPLUS No  components found for: THELPERCELL  Assessment: new patient here with HIV.  Discussed with patient treatment options and side effects, benefits of treatment, long term outcomes.  I discussed the severity of untreated HIV including higher cancer risk, opportunistic infections, renal failure.  Also discussed needing to use condoms, partner disclosure, necessary vaccines, blood monitoring.  All questions answered.    Plan: 1) labbs today 2) pneumovax, meningicoccal vaccines 3) RTC 2 weeks with PharmD for consideration of treatment.  I discussed Genvoya and patient also aware of Triumeq.  Either good if no resistance. He will need condoms next visit.   Refused Manuel.  He can then get labs 4 weeks after starting at his PharmD visit and see me at 5 weeks.

## 2016-01-16 LAB — T-HELPER CELL (CD4) - (RCID CLINIC ONLY)
CD4 T CELL ABS: 340 /uL — AB (ref 400–2700)
CD4 T CELL HELPER: 18 % — AB (ref 33–55)

## 2016-01-18 LAB — QUANTIFERON TB GOLD ASSAY (BLOOD)
INTERFERON GAMMA RELEASE ASSAY: NEGATIVE
Mitogen-Nil: >10 IU/mL
Quantiferon Nil Value: 0.29 IU/mL
Quantiferon Tb Ag Minus Nil Value: <0 IU/mL

## 2016-01-19 LAB — HIV-1 RNA,QN PCR W/REFLEX GENOTYPE
HIV-1 RNA, QN PCR: 4.62 Log cps/mL — ABNORMAL HIGH (ref <1.30)
HIV-1 RNA, QN PCR: 41259 copies/mL — ABNORMAL HIGH (ref <20)

## 2016-01-22 LAB — HLA B*5701: HLA-B 5701 W/RFLX HLA-B HIGH: NEGATIVE

## 2016-01-23 LAB — HIV-1 GENOTYPR PLUS

## 2016-02-06 ENCOUNTER — Encounter: Payer: Self-pay | Admitting: *Deleted

## 2016-02-09 ENCOUNTER — Encounter: Payer: Self-pay | Admitting: Internal Medicine

## 2016-02-09 ENCOUNTER — Ambulatory Visit (INDEPENDENT_AMBULATORY_CARE_PROVIDER_SITE_OTHER): Payer: Self-pay | Admitting: Internal Medicine

## 2016-02-09 VITALS — BP 138/81 | HR 81 | Temp 97.3°F | Wt 197.0 lb

## 2016-02-09 DIAGNOSIS — B20 Human immunodeficiency virus [HIV] disease: Secondary | ICD-10-CM

## 2016-02-09 DIAGNOSIS — R21 Rash and other nonspecific skin eruption: Secondary | ICD-10-CM

## 2016-02-09 DIAGNOSIS — Z21 Asymptomatic human immunodeficiency virus [HIV] infection status: Secondary | ICD-10-CM

## 2016-02-09 MED ORDER — ABACAVIR-DOLUTEGRAVIR-LAMIVUD 600-50-300 MG PO TABS: 1.0000 | ORAL_TABLET | Freq: Every day | ORAL | 11 refills | Status: DC

## 2016-02-10 NOTE — Assessment & Plan Note (Signed)
I discussed medication options with him and will start Triumeq when he has coverage.   rtc for labs about 3-4 weeks after starting medication.

## 2016-02-10 NOTE — Assessment & Plan Note (Signed)
Has resolved.

## 2016-02-10 NOTE — Progress Notes (Signed)
   Subjective:    Patient ID: Douglas Bartlett, male    DOB: 03-27-1990, 26 y.o.   MRN: [id number]  HPI Here for follow up of HIV Unfortunately, since his initial visit, he lost his insurance since her turned 49.  He has not yet filled out anything for ADAP.  No new issues.  Labs reviewed, not available in Epic.  No baseline resistance, HLA [id number] negative, Quantiferon negative, viral load 41,259.  Hepatitis B surface Ab positive, hepatitis A Ab negative. CD4 340.     Review of Systems  Constitutional: Negative for fatigue.  Gastrointestinal: Negative for diarrhea.  Neurological: Negative for dizziness.       Objective:   Physical Exam  Constitutional: He appears well-developed and well-nourished. No distress.  Eyes: No scleral icterus.  Cardiovascular: Normal rate, regular rhythm and normal heart sounds.   Pulmonary/Chest: Effort normal and breath sounds normal. No respiratory distress.  Skin: No rash noted.          Assessment & Plan:

## 2016-02-19 ENCOUNTER — Encounter: Payer: Self-pay | Admitting: Internal Medicine

## 2016-04-03 ENCOUNTER — Encounter (HOSPITAL_COMMUNITY): Payer: Self-pay

## 2016-04-03 ENCOUNTER — Emergency Department (HOSPITAL_COMMUNITY)
Admission: EM | Admit: 2016-04-03 | Discharge: 2016-04-03 | Disposition: A | Discharge status: Home / Self Care | Payer: Self-pay | Attending: Emergency Medicine | Admitting: Emergency Medicine

## 2016-04-03 DIAGNOSIS — J029 Acute pharyngitis, unspecified: Secondary | ICD-10-CM | POA: Insufficient documentation

## 2016-04-03 DIAGNOSIS — F17290 Nicotine dependence, other tobacco product, uncomplicated: Secondary | ICD-10-CM | POA: Insufficient documentation

## 2016-04-03 DIAGNOSIS — Z79899 Other long term (current) drug therapy: Secondary | ICD-10-CM | POA: Insufficient documentation

## 2016-04-03 MED ORDER — AMOXICILLIN 500 MG PO CAPS: 500.0000 mg | ORAL_CAPSULE | Freq: Three times a day (TID) | ORAL | 0 refills | Status: DC

## 2016-04-03 NOTE — ED Provider Notes (Signed)
[facility]-EMERGENCY DEPT Provider Note   CSN: [id number] Arrival date & time: 04/03/16  1114  By signing my name below, I, Doreatha Martin, attest that this documentation has been prepared under the direction and in the presence of  [facility] M. Damian Leavell, NP. Electronically Signed: Doreatha Martin, ED Scribe. 04/03/16. 11:42 AM.    History   Chief Complaint Chief Complaint  Patient presents with  . Sore Throat    HPI Douglas Bartlett is a 26 y.o. male with h/o HIV on antivirals who presents to the Emergency Department complaining of moderate sore throat that began 3 days ago. Pt reports associated cough with green sputum, congestion, rhinorrhea, cervical gland swelling. Pt states he was trying Mucinex, Tylenol severe cold and Alkaseltzer with no relief of symptoms. He states his sore throat is worsened with swallowing. He reports recent sick contact at work. Pt states he has PCP f/u on 04/06/16. He denies fever, chills, diarrhea, constipation, abdominal pain, difficulty tolerating secretions or swallowing, ear pain.  Patient reports that when he gets this in the past he is treated with Amoxicillin with good results.   PCP- Jeanine Luz, FNP  With [facility]   The history is provided by the patient. No language interpreter was used.  Sore Throat  This is a new problem. The current episode started more than 2 days ago. The problem occurs constantly. The problem has been gradually worsening. Pertinent negatives include no abdominal pain. The symptoms are aggravated by swallowing. Nothing relieves the symptoms. He has tried acetaminophen for the symptoms. The treatment provided no relief.    Past Medical History:  Diagnosis Date  . Chicken pox     Patient Active Problem List   Diagnosis Date Noted  . Human immunodeficiency virus I infection (HCC) 01/15/2016  . Routine general medical examination at a health care facility 07/30/2015  . Rash and nonspecific skin eruption 05/26/2015  . Syncopal episodes  11/12/2014    Past Surgical History:  Procedure Laterality Date  . TONSILLECTOMY         Home Medications    Prior to Admission medications   Medication Sig Start Date End Date Taking? Authorizing Provider  abacavir-dolutegravir-lamiVUDine (TRIUMEQ) 600-50-300 MG tablet Take 1 tablet by mouth daily. 02/09/16   Gardiner Barefoot, MD  amoxicillin (AMOXIL) 500 MG capsule Take 1 capsule (500 mg total) by mouth 3 (three) times daily. 04/03/16   Hope Orlene Och, NP    Family History Family History  Problem Relation Age of Onset  . Hypertension Mother   . Healthy Father     Social History Social History  Substance Use Topics  . Smoking status: Current Some Day Smoker    Types: Cigars    Start date: 01/05/2011  . Smokeless tobacco: Never Used  . Alcohol use Yes     Comment: weekends liquor     Allergies   Patient has no known allergies.   Review of Systems Review of Systems  Constitutional: Negative for chills and fever.  HENT: Positive for congestion, rhinorrhea and sore throat. Negative for ear pain and trouble swallowing.   Respiratory: Positive for cough.   Gastrointestinal: Negative for abdominal pain, constipation and diarrhea.    Physical Exam Updated Vital Signs BP 137/88 (BP Location: Right Arm)   Pulse 73   Temp 98.2 F (36.8 C) (Oral)   Resp 18   Ht  (1.88 m)   Wt 88.5 kg   SpO2 99%   BMI 25.04 kg/m   Physical Exam  Constitutional: He appears well-developed and well-nourished.  HENT:  Head: Normocephalic and atraumatic.  Right Ear: Tympanic membrane normal.  Left Ear: Tympanic membrane normal.  Mouth/Throat: Uvula is midline and mucous membranes are normal. Posterior oropharyngeal erythema present. No posterior oropharyngeal edema.  Eyes: Conjunctivae and EOM are normal. Pupils are equal, round, and reactive to light. No scleral icterus.  Cardiovascular: Normal rate and regular rhythm.   Pulmonary/Chest: Effort normal and breath sounds normal. No  respiratory distress. He has no wheezes. He has no rales.  Abdominal: Soft. Bowel sounds are normal. He exhibits no distension. There is no tenderness.  No CVA tenderness  Musculoskeletal: Normal range of motion.  Lymphadenopathy:    He has cervical adenopathy (slightly enlarged anterior cervical nodes).  Neurological: He is alert.  Skin: Skin is warm and dry.  Psychiatric: He has a normal mood and affect. His behavior is normal.  Nursing note and vitals reviewed.  ED Treatments / Results   DIAGNOSTIC STUDIES: Oxygen Saturation is 99% on RA, normal by my interpretation.    COORDINATION OF CARE: 11:41 AM Discussed treatment plan with pt at bedside which includes amoxicillin and pt agreed to plan.   Procedures Procedures (including critical care time)  Medications Ordered in ED Medications - No data to display   Initial Impression / Assessment and Plan / ED Course  I have reviewed the triage vital signs and the nursing notes.    Diagnosis of viral pharyngitis. Pt will be treated with amoxicillin. Discharge with symptomatic tx. No evidence of dehydration. Pt is tolerating secretions. Presentation not concerning for peritonsillar abscess or spread of infection to deep spaces of the throat; patent airway. Specific return precautions discussed. Recommended PCP follow up as scheduled on 04/06/16. Pt appears safe for discharge.   Final Clinical Impressions(s) / ED Diagnoses   Final diagnoses:  Acute pharyngitis, unspecified etiology    New Prescriptions New Prescriptions   AMOXICILLIN (AMOXIL) 500 MG CAPSULE    Take 1 capsule (500 mg total) by mouth 3 (three) times daily.   I personally performed the services described in this documentation, which was scribed in my presence. The recorded information has been reviewed and is accurate.     [place], [place] 04/03/16 1147    Loren Racer, MD 04/04/16 [phone]

## 2016-04-03 NOTE — ED Triage Notes (Signed)
Pt c/o sore throat x 4 days.  Denies any other symptoms.

## 2016-04-03 NOTE — Discharge Instructions (Signed)
Follow up with your doctor as scheduled.

## 2016-04-06 ENCOUNTER — Ambulatory Visit (INDEPENDENT_AMBULATORY_CARE_PROVIDER_SITE_OTHER): Payer: Self-pay | Admitting: Internal Medicine

## 2016-04-06 ENCOUNTER — Encounter: Payer: Self-pay | Admitting: Internal Medicine

## 2016-04-06 VITALS — BP 123/83 | HR 69 | Temp 98.4°F | Ht 73.0 in | Wt 201.0 lb

## 2016-04-06 DIAGNOSIS — Z5181 Encounter for therapeutic drug level monitoring: Secondary | ICD-10-CM

## 2016-04-06 DIAGNOSIS — Z21 Asymptomatic human immunodeficiency virus [HIV] infection status: Secondary | ICD-10-CM

## 2016-04-06 DIAGNOSIS — Z23 Encounter for immunization: Secondary | ICD-10-CM

## 2016-04-06 DIAGNOSIS — B20 Human immunodeficiency virus [HIV] disease: Secondary | ICD-10-CM

## 2016-04-06 LAB — COMPLETE METABOLIC PANEL WITH GFR
ALT: 23 U/L (ref 9–46)
AST: 23 U/L (ref 10–40)
Albumin: 3.9 g/dL (ref 3.6–5.1)
Alkaline Phosphatase: 52 U/L (ref 40–115)
BILIRUBIN TOTAL: 0.3 mg/dL (ref 0.2–1.2)
BUN: 13 mg/dL (ref 7–25)
CALCIUM: 9.2 mg/dL (ref 8.6–10.3)
CHLORIDE: 105 mmol/L (ref 98–110)
CO2: 23 mmol/L (ref 20–31)
Creat: 1.03 mg/dL (ref 0.60–1.35)
GFR, Est Non African American: >89 mL/min (ref >=60)
Glucose, Bld: 87 mg/dL (ref 65–99)
Potassium: 4.4 mmol/L (ref 3.5–5.3)
Sodium: 139 mmol/L (ref 135–146)
TOTAL PROTEIN: 7.7 g/dL (ref 6.1–8.1)

## 2016-04-06 LAB — CBC WITH DIFFERENTIAL/PLATELET
Basophils Absolute: 42 cells/uL (ref 0–200)
Basophils Relative: 1 %
EOS ABS: 42 {cells}/uL (ref 15–500)
Eosinophils Relative: 1 %
HEMATOCRIT: 43.4 % (ref 38.5–50.0)
Hemoglobin: 13.8 g/dL (ref 13.2–17.1)
Lymphocytes Relative: 42 %
Lymphs Abs: 1764 cells/uL (ref 850–3900)
MCH: 28.6 pg (ref 27.0–33.0)
MCHC: 31.8 g/dL — AB (ref 32.0–36.0)
MCV: 90 fL (ref 80.0–100.0)
MONO ABS: 546 {cells}/uL (ref 200–950)
MONOS PCT: 13 %
MPV: 9.5 fL (ref 7.5–12.5)
NEUTROS PCT: 43 %
Neutro Abs: 1806 cells/uL (ref 1500–7800)
PLATELETS: 262 10*3/uL (ref 140–400)
RBC: 4.82 MIL/uL (ref 4.20–5.80)
RDW: 15.1 % — AB (ref 11.0–15.0)
WBC: 4.2 10*3/uL (ref 3.8–10.8)

## 2016-04-06 NOTE — Progress Notes (Signed)
   Subjective:    Patient ID: Douglas Bartlett, male    DOB: 1990-10-18, 26 y.o.   MRN: [id number]  HPI Here for follow up of HIV  Last visit he had lost his insurance and so applied for and now is on [employer] and [employer].  He started Triumeq about 1 month ago and doing well.  No side effects.  No missed doses.    Review of Systems  Constitutional: Negative for fatigue.  Gastrointestinal: Negative for diarrhea.  Skin: Negative for rash.  Neurological: Negative for dizziness.       Objective:   Physical Exam  Constitutional: He appears well-developed and well-nourished. No distress.  Eyes: No scleral icterus.  Cardiovascular: Normal rate, regular rhythm and normal heart sounds.   Skin: No rash noted.          Assessment & Plan:

## 2016-04-06 NOTE — Addendum Note (Signed)
Addended by: Wendall Mola A on: 04/06/2016 11:45 AM   Modules accepted: Orders

## 2016-04-06 NOTE — Assessment & Plan Note (Signed)
Will check CD4 and viral load to confirm suppression.

## 2016-04-06 NOTE — Assessment & Plan Note (Signed)
Will check creat, LFTs today

## 2016-04-07 LAB — T-HELPER CELL (CD4) - (RCID CLINIC ONLY)
CD4 % Helper T Cell: 25 % — ABNORMAL LOW (ref 33–55)
CD4 T CELL ABS: 490 /uL (ref 400–2700)

## 2016-04-08 LAB — HIV-1 RNA QUANT-NO REFLEX-BLD
HIV 1 RNA QUANT: 90 {copies}/mL — AB
HIV-1 RNA QUANT, LOG: 1.95 {Log_copies}/mL — AB

## 2016-06-19 ENCOUNTER — Emergency Department (HOSPITAL_COMMUNITY)
Admission: EM | Admit: 2016-06-19 | Discharge: 2016-06-19 | Disposition: A | Discharge status: Home / Self Care | Payer: Self-pay | Attending: Emergency Medicine | Admitting: Emergency Medicine

## 2016-06-19 ENCOUNTER — Encounter (HOSPITAL_COMMUNITY): Payer: Self-pay | Admitting: Emergency Medicine

## 2016-06-19 ENCOUNTER — Emergency Department (HOSPITAL_COMMUNITY): Payer: Self-pay

## 2016-06-19 DIAGNOSIS — Y999 Unspecified external cause status: Secondary | ICD-10-CM | POA: Insufficient documentation

## 2016-06-19 DIAGNOSIS — X509XXA Other and unspecified overexertion or strenuous movements or postures, initial encounter: Secondary | ICD-10-CM | POA: Insufficient documentation

## 2016-06-19 DIAGNOSIS — S43084A Other dislocation of right shoulder joint, initial encounter: Secondary | ICD-10-CM | POA: Insufficient documentation

## 2016-06-19 DIAGNOSIS — Z79899 Other long term (current) drug therapy: Secondary | ICD-10-CM | POA: Insufficient documentation

## 2016-06-19 DIAGNOSIS — S43004A Unspecified dislocation of right shoulder joint, initial encounter: Secondary | ICD-10-CM

## 2016-06-19 DIAGNOSIS — F17290 Nicotine dependence, other tobacco product, uncomplicated: Secondary | ICD-10-CM | POA: Insufficient documentation

## 2016-06-19 DIAGNOSIS — Y929 Unspecified place or not applicable: Secondary | ICD-10-CM | POA: Insufficient documentation

## 2016-06-19 DIAGNOSIS — Y939 Activity, unspecified: Secondary | ICD-10-CM | POA: Insufficient documentation

## 2016-06-19 MED ORDER — LIDOCAINE-EPINEPHRINE (PF) 2 %-1:200000 IJ SOLN
20.0000 mL | Freq: Once | INTRAMUSCULAR | Status: AC
  Administered 2016-06-19: 20 mL via INTRADERMAL
  Filled 2016-06-19: qty 20

## 2016-06-19 MED ORDER — FENTANYL CITRATE (PF) 100 MCG/2ML IJ SOLN
100.0000 ug | Freq: Once | INTRAMUSCULAR | Status: AC
  Administered 2016-06-19: 100 ug via INTRAVENOUS
  Filled 2016-06-19: qty 2

## 2016-06-19 NOTE — Discharge Instructions (Signed)
Follow up with ortho.   Take 4 over the counter ibuprofen tablets 3 times a day or 2 over-the-counter naproxen tablets twice a day for pain. Also take tylenol 1000mg (2 extra strength) four times a day.
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
## 2016-06-29 ENCOUNTER — Encounter: Payer: Self-pay | Admitting: Medical

## 2016-06-29 ENCOUNTER — Ambulatory Visit (INDEPENDENT_AMBULATORY_CARE_PROVIDER_SITE_OTHER): Payer: Self-pay | Admitting: Medical

## 2016-06-29 VITALS — BP 124/86 | HR 70 | Temp 98.5°F

## 2016-06-29 DIAGNOSIS — M21821 Other specified acquired deformities of right upper arm: Secondary | ICD-10-CM

## 2016-06-29 DIAGNOSIS — S43014D Anterior dislocation of right humerus, subsequent encounter: Secondary | ICD-10-CM

## 2016-06-29 NOTE — Patient Instructions (Addendum)
Continue Naproxen - 2 tablets every 12 hours as needed. I recommend wearing your sling as much as possible for the next 10 days. Perform the pendulum shoulder exercises we discussed to avoid stiffness in your shoulder. You may also apply a heating pad to your shoulder for 15-20 minutes 3-4 times/day. I recommend you follow up with an orthopedic specialist as soon as possible.   Shoulder Dislocation Your shoulder joint is made up of 3 bones:  The upper arm bone (humerus).  The shoulder blade (scapula).  The collarbone (clavicle).  A shoulder dislocation happens when your upper arm bone moves out of its normal place in your shoulder joint. Follow these instructions at home: If you have a splint or sling:  Wear it as told by your doctor.  Take it off only as told by your doctor.  Loosen it if: ? Your fingers become numb and tingly. ? Your fingers turn cold and blue.  Keep it clean and dry. Bathing  Do not take baths, swim, or use a hot tub until your doctor says you can. Ask your doctor if you can take showers. You may only be allowed to take sponge baths.  If your doctor says taking baths or showers is okay, cover your splint or sling with a plastic bag. Do not let the splint or sling get wet. Managing pain, stiffness, and swelling  If told, put ice on the injured area. ? Put ice in a plastic bag. ? Place a towel between your skin and the bag. ? Leave the ice on for 20 minutes, 2-3 times per day.  Move your fingers often to avoid stiffness and to lessen swelling.  Raise (elevate) the injured area above the level of your heart while you are sitting or lying down. Driving  Do not drive while you are wearing a splint or sling on a hand that you use for driving.  Do not drive or operate heavy machinery while taking pain medicine. Activity  Return to your normal activities as told by your doctor. Ask your doctor what activities are safe for you.  Do range-of-motion  exercises only as told by your doctor.  Exercise your hand by squeezing a soft ball. This keeps your hand and wrist from getting stiff and swollen. General instructions  Take over-the-counter and prescription medicines only as told by your doctor.  Do not use any tobacco products, including cigarettes, chewing tobacco, or e-cigarettes. Tobacco can slow down healing. If you need help quitting, ask your doctor.  Keep all follow-up visits as told by your doctor. This is important. Contact a doctor if:  Your splint or sling gets damaged. Get help right away if:  Your pain gets worse instead of better.  You lose feeling in your arm or hand.  Your arm or hand turns white and cold. This information is not intended to replace advice given to you by your health care provider. Make sure you discuss any questions you have with your health care provider. Document Released: 03/15/2011 Document Revised: 08/17/2015 Document Reviewed: 04/15/2014 Elsevier Interactive Patient Education  [employer]2018 [employer]
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
## 2016-07-06 ENCOUNTER — Encounter: Payer: Self-pay | Admitting: Internal Medicine

## 2016-07-06 ENCOUNTER — Ambulatory Visit (INDEPENDENT_AMBULATORY_CARE_PROVIDER_SITE_OTHER): Payer: Self-pay | Admitting: Internal Medicine

## 2016-07-06 VITALS — BP 124/81 | HR 61 | Temp 97.9°F | Ht 73.0 in | Wt 203.0 lb

## 2016-07-06 DIAGNOSIS — B20 Human immunodeficiency virus [HIV] disease: Secondary | ICD-10-CM

## 2016-07-06 DIAGNOSIS — Z7189 Other specified counseling: Secondary | ICD-10-CM

## 2016-07-06 DIAGNOSIS — Z7185 Encounter for immunization safety counseling: Secondary | ICD-10-CM

## 2016-07-06 DIAGNOSIS — Z23 Encounter for immunization: Secondary | ICD-10-CM | POA: Insufficient documentation

## 2016-07-06 NOTE — Progress Notes (Signed)
   Subjective:    Patient ID: Douglas SallesAnthony Raabe, male    DOB: [birth year], 26 y.o.   MRN: [id number]  HPI Here for follow up of HIV He has been on Triumeq but unfortunately lost his insurance and was off medications for about 1 month.  He got ADAP and now has been back  On it over 3 months.  When I saw him 3 months ago his CD4 was 490 and viral load only 90 copies.  He continues on Triumeq and now continues to do well.  Renewed ADAP today as well.   No associated n/v/d.    Review of Systems  Constitutional: Negative for fatigue.  Gastrointestinal: Negative for diarrhea.  Skin: Negative for rash.  Neurological: Negative for dizziness.       Objective:   Physical Exam  Constitutional: He appears well-developed and well-nourished. No distress.  Eyes: No scleral icterus.  Cardiovascular: Normal rate, regular rhythm and normal heart sounds.   No murmur heard. Pulmonary/Chest: Effort normal and breath sounds normal. No respiratory distress.  Skin: No rash noted.          Assessment & Plan:

## 2016-07-06 NOTE — Assessment & Plan Note (Signed)
Counseled on Menveo, refused.

## 2016-07-06 NOTE — Assessment & Plan Note (Signed)
Doing well now and will recheck viral load today to be sure suppressed and no resistance developed.  rtc 6 months otherwise

## 2016-07-08 LAB — T-HELPER CELL (CD4) - (RCID CLINIC ONLY)
CD4 % Helper T Cell: 25 % — ABNORMAL LOW (ref 33–55)
CD4 T Cell Abs: 580 /uL (ref 400–2700)

## 2016-07-09 LAB — HIV-1 RNA QUANT-NO REFLEX-BLD
HIV 1 RNA Quant: 23 {copies}/mL — ABNORMAL HIGH
HIV-1 RNA Quant, Log: 1.36 {Log_copies}/mL — ABNORMAL HIGH

## 2016-07-21 ENCOUNTER — Telehealth: Payer: Self-pay | Admitting: Family

## 2016-07-21 NOTE — Telephone Encounter (Signed)
You have not seen pt for this condition. Please advise

## 2016-07-21 NOTE — Telephone Encounter (Signed)
It appears he has been seen for a "fitness of duty" evaluation on 6/26 and advised to follow up with orthopedics. Would recommend follow up with orthopedics if he has not.

## 2016-07-21 NOTE — Telephone Encounter (Signed)
Pt called wanting to know if Tammy SoursGreg would fill out FMLA paperwork so he can continue getting paid, he dislocated his shoulder June.

## 2016-07-22 ENCOUNTER — Other Ambulatory Visit: Payer: Self-pay | Admitting: Family

## 2016-07-22 NOTE — Telephone Encounter (Signed)
Pt states that he does not have insurance and can not afford $300 dollars for orthopedic visit. He set up an appointment for Monday with you bc he can afford $80 copay better. He said he has been out of work for a month so that is how long he needs the FMLA for. Please advise if you are willing to do this with OV.

## 2016-07-26 ENCOUNTER — Ambulatory Visit: Payer: Self-pay | Admitting: Family

## 2016-07-29 ENCOUNTER — Encounter: Payer: Self-pay | Admitting: Internal Medicine

## 2016-10-25 ENCOUNTER — Encounter (HOSPITAL_COMMUNITY): Payer: Self-pay

## 2016-10-25 ENCOUNTER — Other Ambulatory Visit: Payer: Self-pay

## 2016-10-25 ENCOUNTER — Emergency Department (HOSPITAL_COMMUNITY): Payer: Self-pay

## 2016-10-25 DIAGNOSIS — F17290 Nicotine dependence, other tobacco product, uncomplicated: Secondary | ICD-10-CM | POA: Insufficient documentation

## 2016-10-25 DIAGNOSIS — H66001 Acute suppurative otitis media without spontaneous rupture of ear drum, right ear: Secondary | ICD-10-CM | POA: Insufficient documentation

## 2016-10-25 DIAGNOSIS — R072 Precordial pain: Secondary | ICD-10-CM | POA: Insufficient documentation

## 2016-10-25 DIAGNOSIS — B20 Human immunodeficiency virus [HIV] disease: Secondary | ICD-10-CM | POA: Insufficient documentation

## 2016-10-25 DIAGNOSIS — Z79899 Other long term (current) drug therapy: Secondary | ICD-10-CM | POA: Insufficient documentation

## 2016-10-25 DIAGNOSIS — J40 Bronchitis, not specified as acute or chronic: Secondary | ICD-10-CM | POA: Insufficient documentation

## 2016-10-25 DIAGNOSIS — J3489 Other specified disorders of nose and nasal sinuses: Secondary | ICD-10-CM | POA: Insufficient documentation

## 2016-10-25 LAB — BASIC METABOLIC PANEL
Anion gap: 8 (ref 5–15)
BUN: 14 mg/dL (ref 6–20)
CO2: 26 mmol/L (ref 22–32)
CREATININE: 0.94 mg/dL (ref 0.61–1.24)
Calcium: 9.1 mg/dL (ref 8.9–10.3)
Chloride: 105 mmol/L (ref 101–111)
GFR calc Af Amer: >60 mL/min (ref >60)
Glucose, Bld: 109 mg/dL — ABNORMAL HIGH (ref 65–99)
POTASSIUM: 3.6 mmol/L (ref 3.5–5.1)
SODIUM: 139 mmol/L (ref 135–145)

## 2016-10-25 LAB — CBC
HEMATOCRIT: 46.2 % (ref 39.0–52.0)
Hemoglobin: 15.5 g/dL (ref 13.0–17.0)
MCH: 30.6 pg (ref 26.0–34.0)
MCHC: 33.5 g/dL (ref 30.0–36.0)
MCV: 91.1 fL (ref 78.0–100.0)
PLATELETS: 225 10*3/uL (ref 150–400)
RBC: 5.07 MIL/uL (ref 4.22–5.81)
RDW: 12.9 % (ref 11.5–15.5)
WBC: 3.8 10*3/uL — AB (ref 4.0–10.5)

## 2016-10-25 LAB — POCT I-STAT TROPONIN I: Troponin i, poc: 0 ng/mL (ref 0.00–0.08)

## 2016-10-25 NOTE — ED Triage Notes (Signed)
Pt c/o productive cough, congestion, HA, chills x1 week. Pt experiencing worsening symptoms and new onset of chest pain, worse while coughing. Denies shortness of breath, N/V. Pt has been taking mucinex with no relief.

## 2016-10-26 ENCOUNTER — Emergency Department (HOSPITAL_COMMUNITY)
Admission: EM | Admit: 2016-10-26 | Discharge: 2016-10-26 | Disposition: A | Discharge status: Home / Self Care | Payer: Self-pay | Attending: Emergency Medicine | Admitting: Emergency Medicine

## 2016-10-26 DIAGNOSIS — R059 Cough, unspecified: Secondary | ICD-10-CM

## 2016-10-26 DIAGNOSIS — J40 Bronchitis, not specified as acute or chronic: Secondary | ICD-10-CM

## 2016-10-26 DIAGNOSIS — R05 Cough: Secondary | ICD-10-CM

## 2016-10-26 DIAGNOSIS — H66001 Acute suppurative otitis media without spontaneous rupture of ear drum, right ear: Secondary | ICD-10-CM

## 2016-10-26 DIAGNOSIS — R072 Precordial pain: Secondary | ICD-10-CM

## 2016-10-26 MED ORDER — AEROCHAMBER PLUS FLO-VU MEDIUM MISC
1.0000 | Freq: Once | Status: AC
  Administered 2016-10-26: 1
  Filled 2016-10-26: qty 1

## 2016-10-26 MED ORDER — FLUTICASONE PROPIONATE 50 MCG/ACT NA SUSP: 2.0000 | Freq: Every day | NASAL | 2 refills | Status: DC

## 2016-10-26 MED ORDER — AMOXICILLIN 500 MG PO CAPS: 500.0000 mg | ORAL_CAPSULE | Freq: Three times a day (TID) | ORAL | 0 refills | Status: DC

## 2016-10-26 MED ORDER — ALBUTEROL SULFATE HFA 108 (90 BASE) MCG/ACT IN AERS
2.0000 | INHALATION_SPRAY | RESPIRATORY_TRACT | Status: DC | PRN
  Administered 2016-10-26: 2 via RESPIRATORY_TRACT
  Filled 2016-10-26: qty 6.7

## 2016-10-26 MED ORDER — ALBUTEROL SULFATE (2.5 MG/3ML) 0.083% IN NEBU
5.0000 mg | INHALATION_SOLUTION | Freq: Once | RESPIRATORY_TRACT | Status: AC
  Administered 2016-10-26: 5 mg via RESPIRATORY_TRACT
  Filled 2016-10-26: qty 6

## 2016-10-26 NOTE — ED Provider Notes (Signed)
[facility]-EMERGENCY DEPT Provider Note   CSN: [id number] Arrival date & time: 10/25/16  2146     History   Chief Complaint Chief Complaint  Patient presents with  . Cough  . Chest Pain    HPI Douglas Bartlett is a 26 y.o. male with a hx of HIV (on Triumeq; last lab 07/06/16 - CD4 580, viral load 23) on  presents to the Emergency Department complaining of gradual, persistent, progressively worsening URI symptoms onset 1 week ago. Associated symptoms include subjective fevers, rhinorrhea, R otalgia, sore throat, cough, generalized headache.  Pt reports he has had chest tightness and chest pain with coughing for the last several days.  He reports he smokes intermittently, but denies a hx of asthma or COPD.  Pt denies SOB, N/V/D, weakness, dizziness, syncope, neck stiffness.  He reports no known sick contacts. He has take OTC mucinex without significant relief.     The history is provided by the patient and medical records.    Past Medical History:  Diagnosis Date  . Chicken pox     Patient Active Problem List   Diagnosis Date Noted  . Vaccine counseling 07/06/2016  . Medication monitoring encounter 04/06/2016  . Human immunodeficiency virus I infection (HCC) 01/15/2016  . Routine general medical examination at a health care facility 07/30/2015  . Rash and nonspecific skin eruption 05/26/2015  . Syncopal episodes 11/12/2014    Past Surgical History:  Procedure Laterality Date  . TONSILLECTOMY         Home Medications    Prior to Admission medications   Medication Sig Start Date End Date Taking? Authorizing Provider  abacavir-dolutegravir-lamiVUDine (TRIUMEQ) 600-50-300 MG tablet Take 1 tablet by mouth daily. 02/09/16   Gardiner Barefoot, MD  amoxicillin (AMOXIL) 500 MG capsule Take 1 capsule (500 mg total) by mouth 3 (three) times daily. 10/26/16   Kyasia Steuck, Dahlia Client, PA-C  fluticasone (FLONASE) 50 MCG/ACT nasal spray Place 2 sprays into both nostrils  daily. 10/26/16   Lenetta Piche, Dahlia Client, PA-C    Family History Family History  Problem Relation Age of Onset  . Hypertension Mother   . Healthy Father     Social History Social History  Substance Use Topics  . Smoking status: Current Some Day Smoker    Types: Cigars    Start date: 01/05/2011  . Smokeless tobacco: Never Used  . Alcohol use Yes     Comment: weekends liquor     Allergies   Patient has no known allergies.   Review of Systems Review of Systems  Constitutional: Positive for fatigue. Negative for appetite change, chills and fever.  HENT: Positive for congestion, ear pain ( right), postnasal drip, rhinorrhea, sinus pressure and sore throat. Negative for ear discharge and mouth sores.   Eyes: Negative for visual disturbance.  Respiratory: Positive for cough and chest tightness. Negative for shortness of breath, wheezing and stridor.   Cardiovascular: Positive for chest pain. Negative for palpitations and leg swelling.  Gastrointestinal: Negative for abdominal pain, diarrhea, nausea and vomiting.  Genitourinary: Negative for dysuria, frequency, hematuria and urgency.  Musculoskeletal: Negative for arthralgias, back pain, myalgias and neck stiffness.  Skin: Negative for rash.  Neurological: Positive for headaches ( no present currently). Negative for syncope, light-headedness and numbness.  Hematological: Negative for adenopathy.  Psychiatric/Behavioral: The patient is not nervous/anxious.   All other systems reviewed and are negative.    Physical Exam Updated Vital Signs BP 133/81 (BP Location: Left Arm)   Pulse 89  Temp 98.4 F (36.9 C) (Oral)   Resp 18   Ht 6' (1.829 m)   Wt 98.9 kg (218 lb)   SpO2 98%   BMI 29.57 kg/m   Physical Exam  Constitutional: He appears well-developed and well-nourished. No distress.  HENT:  Head: Normocephalic and atraumatic.  Right Ear: External ear and ear canal normal. Tympanic membrane is injected, erythematous and  bulging. A middle ear effusion is present.  Left Ear: Tympanic membrane, external ear and ear canal normal. Tympanic membrane is not injected, not erythematous and not bulging.  No middle ear effusion.  Nose: Mucosal edema and rhinorrhea present. No epistaxis. Right sinus exhibits no maxillary sinus tenderness and no frontal sinus tenderness. Left sinus exhibits no maxillary sinus tenderness and no frontal sinus tenderness.  Mouth/Throat: Uvula is midline and mucous membranes are normal. Mucous membranes are not pale and not cyanotic. No oropharyngeal exudate, posterior oropharyngeal edema, posterior oropharyngeal erythema or tonsillar abscesses.  Eyes: Pupils are equal, round, and reactive to light. Conjunctivae are normal.  Neck: Normal range of motion and full passive range of motion without pain.  Cardiovascular: Normal rate and intact distal pulses.   Pulmonary/Chest: Effort normal. No accessory muscle usage or stridor. No tachypnea. No respiratory distress. He has decreased breath sounds. He has wheezes (fine expiratory throughout).  Congested cough No focal breath sounds  Abdominal: Soft. There is no tenderness.  Musculoskeletal: Normal range of motion.  Lymphadenopathy:    He has no cervical adenopathy.  Neurological: He is alert.  Skin: Skin is warm and dry. No rash noted. He is not diaphoretic.  Psychiatric: He has a normal mood and affect.  Nursing note and vitals reviewed.    ED Treatments / Results  Labs (all labs ordered are listed, but only abnormal results are displayed) Labs Reviewed  BASIC METABOLIC PANEL - Abnormal; Notable for the following:       Result Value   Glucose, Bld 109 (*)    All other components within normal limits  CBC - Abnormal; Notable for the following:    WBC 3.8 (*)    All other components within normal limits  I-STAT TROPONIN, ED  POCT I-STAT TROPONIN I    Radiology Dg Chest 2 View  Result Date: 10/25/2016 CLINICAL DATA:  Productive  cough. Chills. Chest congestion and chest pain for 1 week. EXAM: CHEST  2 VIEW COMPARISON:  None. FINDINGS: Normal sized heart.  Clear lungs.  Minimal scoliosis. IMPRESSION: No acute abnormality. Electronically Signed   By: Beckie Salts M.D.   On: 10/25/2016 23:03    Procedures Procedures (including critical care time)  Medications Ordered in ED Medications  albuterol (PROVENTIL HFA;VENTOLIN HFA) 108 (90 Base) MCG/ACT inhaler 2 puff (2 puffs Inhalation Given 10/26/16 0330)  albuterol (PROVENTIL) (2.5 MG/3ML) 0.083% nebulizer solution 5 mg (5 mg Nebulization Given 10/26/16 0250)  AEROCHAMBER PLUS FLO-VU MEDIUM MISC 1 each (1 each Other Given 10/26/16 0330)     Initial Impression / Assessment and Plan / ED Course  I have reviewed the triage vital signs and the nursing notes.  Pertinent labs & imaging results that were available during my care of the patient were reviewed by me and considered in my medical decision making (see chart for details).  Clinical Course as of Oct 26 328  Tue Oct 26, 2016  4098 Pt with significantly improved breath sounds after albuterol treatment and reports that his chest pain has resolved.    [HM]    Clinical Course User  Index [HM] Darriana Deboy, PA-C    Pt CXR negative forDahlia Client acute infiltrate. Patients symptoms are consistent with URI, likely viral etiology. Pt with significant improvement after albuterol nebulizer. Pt will be discharged with symptomatic treatment. Afebrile here.  Labs reassuring.  No signs of sepsis.  Patient also with otalgia and exam consistent with acute otitis media. No concern for acute mastoiditis, meningitis. Pt will be d/c home with Amoxicillin.  Pt will need PCP follow-up if symptoms do not improve.  Pt states understanding and agrees with the plan.     Final Clinical Impressions(s) / ED Diagnoses   Final diagnoses:  Cough  Bronchitis  Acute suppurative otitis media of right ear without spontaneous rupture of tympanic  membrane, recurrence not specified  Precordial pain    New Prescriptions New Prescriptions   AMOXICILLIN (AMOXIL) 500 MG CAPSULE    Take 1 capsule (500 mg total) by mouth 3 (three) times daily.   FLUTICASONE (FLONASE) 50 MCG/ACT NASAL SPRAY    Place 2 sprays into both nostrils daily.     Kyzer Blowe, Dahlia ClientHannah, PA-C 10/26/16 0330    Gilda CreasePollina, Christopher J, MD 10/26/16 [id number]

## 2016-10-26 NOTE — Discharge Instructions (Signed)
1. Medications: flonase, OTC mucinex, albuterol, amoxicillin, usual home medications 2. Treatment: rest, drink plenty of fluids, take tylenol or ibuprofen for fever control 3. Follow Up: Please followup with your primary doctor in 3 days for discussion of your diagnoses and further evaluation after today's visit; if you do not have a primary care doctor use the resource guide provided to find one; Return to the ER for high fevers, difficulty breathing or other concerning symptoms

## 2017-01-06 ENCOUNTER — Encounter: Payer: Self-pay | Admitting: Internal Medicine

## 2017-01-06 ENCOUNTER — Ambulatory Visit (INDEPENDENT_AMBULATORY_CARE_PROVIDER_SITE_OTHER): Payer: Self-pay | Admitting: Internal Medicine

## 2017-01-06 ENCOUNTER — Other Ambulatory Visit: Payer: Self-pay | Admitting: *Deleted

## 2017-01-06 VITALS — BP 121/78 | HR 73 | Temp 98.1°F | Ht 74.0 in | Wt 230.0 lb

## 2017-01-06 DIAGNOSIS — B20 Human immunodeficiency virus [HIV] disease: Secondary | ICD-10-CM

## 2017-01-06 DIAGNOSIS — Z79899 Other long term (current) drug therapy: Secondary | ICD-10-CM

## 2017-01-06 DIAGNOSIS — Z113 Encounter for screening for infections with a predominantly sexual mode of transmission: Secondary | ICD-10-CM | POA: Insufficient documentation

## 2017-01-06 DIAGNOSIS — Z7189 Other specified counseling: Secondary | ICD-10-CM

## 2017-01-06 DIAGNOSIS — Z7185 Encounter for immunization safety counseling: Secondary | ICD-10-CM

## 2017-01-06 DIAGNOSIS — Z23 Encounter for immunization: Secondary | ICD-10-CM

## 2017-01-06 MED ORDER — ABACAVIR-DOLUTEGRAVIR-LAMIVUD 600-50-300 MG PO TABS: 1.0000 | ORAL_TABLET | Freq: Every day | ORAL | 5 refills | Status: DC

## 2017-01-06 NOTE — Assessment & Plan Note (Signed)
Doing well.  Labs today and rtc 6 months unless concerns.  

## 2017-01-06 NOTE — Assessment & Plan Note (Signed)
Will screen today 

## 2017-01-06 NOTE — Assessment & Plan Note (Signed)
Counseled on flu shot and given today 

## 2017-01-06 NOTE — Progress Notes (Signed)
   Subjective:    Patient ID: Jeni SallesAnthony Bann, male    DOB: [birth year], 27 y.o.   MRN: [id number]  HPI Here for follow up of HIV Continues on Triumeq and denies any missed doses.  No associated n/v/d.  No rashes.  Had some weight gain.  Uses condoms.  Did not run out of medication but needs 1 month assistance due to lapse in coverage.     Review of Systems  Constitutional: Negative for fatigue.  Gastrointestinal: Negative for diarrhea.  Skin: Negative for rash.  Neurological: Negative for dizziness.       Objective:   Physical Exam  Constitutional: He appears well-developed and well-nourished. No distress.  HENT:  Mouth/Throat: No oropharyngeal exudate.  Eyes: No scleral icterus.  Cardiovascular: Normal rate, regular rhythm and normal heart sounds.  No murmur heard. Pulmonary/Chest: Effort normal and breath sounds normal. No respiratory distress.  Lymphadenopathy:    He has no cervical adenopathy.  Skin: No rash noted.   SH: no tobacco       Assessment & Plan:

## 2017-01-06 NOTE — Assessment & Plan Note (Signed)
Lipid panel today

## 2017-01-07 LAB — CBC WITH DIFFERENTIAL/PLATELET
BASOS ABS: 30 {cells}/uL (ref 0–200)
Basophils Relative: 0.7 %
EOS PCT: 5.1 %
Eosinophils Absolute: 219 cells/uL (ref 15–500)
HCT: 43.5 % (ref 38.5–50.0)
HEMOGLOBIN: 14.7 g/dL (ref 13.2–17.1)
LYMPHS ABS: 2202 {cells}/uL (ref 850–3900)
MCH: 30 pg (ref 27.0–33.0)
MCHC: 33.8 g/dL (ref 32.0–36.0)
MCV: 88.8 fL (ref 80.0–100.0)
MONOS PCT: 14 %
MPV: 9.9 fL (ref 7.5–12.5)
NEUTROS ABS: 1247 {cells}/uL — AB (ref 1500–7800)
NEUTROS PCT: 29 %
Platelets: 254 10*3/uL (ref 140–400)
RBC: 4.9 10*6/uL (ref 4.20–5.80)
RDW: 13.6 % (ref 11.0–15.0)
Total Lymphocyte: 51.2 %
WBC mixed population: 602 cells/uL (ref 200–950)
WBC: 4.3 10*3/uL (ref 3.8–10.8)

## 2017-01-07 LAB — RPR TITER: RPR Titer: 1:1 {titer} — ABNORMAL HIGH

## 2017-01-07 LAB — T-HELPER CELL (CD4) - (RCID CLINIC ONLY)
CD4 T CELL ABS: 740 /uL (ref 400–2700)
CD4 T CELL HELPER: 28 % — AB (ref 33–55)

## 2017-01-07 LAB — COMPLETE METABOLIC PANEL WITH GFR
AG RATIO: 1.3 (calc) (ref 1.0–2.5)
ALKALINE PHOSPHATASE (APISO): 75 U/L (ref 40–115)
ALT: 43 U/L (ref 9–46)
AST: 25 U/L (ref 10–40)
Albumin: 4.2 g/dL (ref 3.6–5.1)
BILIRUBIN TOTAL: 0.5 mg/dL (ref 0.2–1.2)
BUN: 14 mg/dL (ref 7–25)
CHLORIDE: 105 mmol/L (ref 98–110)
CO2: 27 mmol/L (ref 20–32)
Calcium: 9.2 mg/dL (ref 8.6–10.3)
Creat: 0.93 mg/dL (ref 0.60–1.35)
GFR, Est African American: 131 mL/min/{1.73_m2} (ref >=60)
GFR, Est Non African American: 113 mL/min/{1.73_m2} (ref >=60)
GLUCOSE: 97 mg/dL (ref 65–99)
Globulin: 3.2 g/dL (calc) (ref 1.9–3.7)
POTASSIUM: 4.3 mmol/L (ref 3.5–5.3)
Sodium: 140 mmol/L (ref 135–146)
Total Protein: 7.4 g/dL (ref 6.1–8.1)

## 2017-01-07 LAB — URINE CYTOLOGY ANCILLARY ONLY
Chlamydia: NEGATIVE
NEISSERIA GONORRHEA: NEGATIVE

## 2017-01-07 LAB — LIPID PANEL
CHOLESTEROL: 166 mg/dL (ref <200)
HDL: 37 mg/dL — AB (ref >40)
LDL Cholesterol (Calc): 106 mg/dL (calc) — ABNORMAL HIGH
Non-HDL Cholesterol (Calc): 129 mg/dL (calc) (ref <130)
TRIGLYCERIDES: 132 mg/dL (ref <150)
Total CHOL/HDL Ratio: 4.5 (calc) (ref <5.0)

## 2017-01-07 LAB — FLUORESCENT TREPONEMAL AB(FTA)-IGG-BLD: Fluorescent Treponemal ABS: REACTIVE — AB

## 2017-01-07 LAB — RPR: RPR Ser Ql: REACTIVE — AB

## 2017-01-10 LAB — HIV-1 RNA QUANT-NO REFLEX-BLD
HIV 1 RNA Quant: 36 copies/mL — ABNORMAL HIGH
HIV-1 RNA QUANT, LOG: 1.56 {Log_copies}/mL — AB

## 2017-01-12 ENCOUNTER — Encounter: Payer: Self-pay | Admitting: Internal Medicine

## 2017-02-16 ENCOUNTER — Other Ambulatory Visit: Payer: Self-pay | Admitting: Internal Medicine

## 2017-05-09 ENCOUNTER — Encounter (HOSPITAL_COMMUNITY): Payer: Self-pay

## 2017-05-09 ENCOUNTER — Other Ambulatory Visit: Payer: Self-pay

## 2017-05-09 ENCOUNTER — Emergency Department (HOSPITAL_COMMUNITY): Payer: Worker's Compensation

## 2017-05-09 ENCOUNTER — Emergency Department (HOSPITAL_COMMUNITY)
Admission: EM | Admit: 2017-05-09 | Discharge: 2017-05-10 | Disposition: A | Discharge status: Home / Self Care | Payer: Worker's Compensation | Attending: Emergency Medicine | Admitting: Emergency Medicine

## 2017-05-09 DIAGNOSIS — Y929 Unspecified place or not applicable: Secondary | ICD-10-CM | POA: Diagnosis not present

## 2017-05-09 DIAGNOSIS — Y9301 Activity, walking, marching and hiking: Secondary | ICD-10-CM | POA: Diagnosis not present

## 2017-05-09 DIAGNOSIS — Y998 Other external cause status: Secondary | ICD-10-CM | POA: Diagnosis not present

## 2017-05-09 DIAGNOSIS — G8911 Acute pain due to trauma: Secondary | ICD-10-CM | POA: Insufficient documentation

## 2017-05-09 DIAGNOSIS — W1789XA Other fall from one level to another, initial encounter: Secondary | ICD-10-CM | POA: Insufficient documentation

## 2017-05-09 DIAGNOSIS — S52124A Nondisplaced fracture of head of right radius, initial encounter for closed fracture: Secondary | ICD-10-CM | POA: Diagnosis not present

## 2017-05-09 DIAGNOSIS — S0219XA Other fracture of base of skull, initial encounter for closed fracture: Secondary | ICD-10-CM

## 2017-05-09 DIAGNOSIS — W19XXXA Unspecified fall, initial encounter: Secondary | ICD-10-CM

## 2017-05-09 DIAGNOSIS — S025XXA Fracture of tooth (traumatic), initial encounter for closed fracture: Secondary | ICD-10-CM | POA: Insufficient documentation

## 2017-05-09 DIAGNOSIS — S01511A Laceration without foreign body of lip, initial encounter: Secondary | ICD-10-CM | POA: Insufficient documentation

## 2017-05-09 DIAGNOSIS — S0993XA Unspecified injury of face, initial encounter: Secondary | ICD-10-CM | POA: Diagnosis present

## 2017-05-09 LAB — PROTIME-INR
INR: 1.01
Prothrombin Time: 13.2 seconds (ref 11.4–15.2)

## 2017-05-09 LAB — COMPREHENSIVE METABOLIC PANEL
ALK PHOS: 77 U/L (ref 38–126)
ALT: 29 U/L (ref 17–63)
AST: 30 U/L (ref 15–41)
Albumin: 4.3 g/dL (ref 3.5–5.0)
Anion gap: 13 (ref 5–15)
BILIRUBIN TOTAL: 0.8 mg/dL (ref 0.3–1.2)
BUN: 17 mg/dL (ref 6–20)
CALCIUM: 9.3 mg/dL (ref 8.9–10.3)
CHLORIDE: 104 mmol/L (ref 101–111)
CO2: 21 mmol/L — ABNORMAL LOW (ref 22–32)
CREATININE: 1.17 mg/dL (ref 0.61–1.24)
GFR calc Af Amer: >60 mL/min (ref >60)
GFR calc non Af Amer: >60 mL/min (ref >60)
GLUCOSE: 108 mg/dL — AB (ref 65–99)
Potassium: 4.3 mmol/L (ref 3.5–5.1)
Sodium: 138 mmol/L (ref 135–145)
Total Protein: 8 g/dL (ref 6.5–8.1)

## 2017-05-09 LAB — URINALYSIS, ROUTINE W REFLEX MICROSCOPIC
BILIRUBIN URINE: NEGATIVE
GLUCOSE, UA: NEGATIVE mg/dL
HGB URINE DIPSTICK: NEGATIVE
Ketones, ur: 20 mg/dL — AB
Leukocytes, UA: NEGATIVE
Nitrite: NEGATIVE
Protein, ur: NEGATIVE mg/dL
Specific Gravity, Urine: 1.038 — ABNORMAL HIGH (ref 1.005–1.030)
pH: 6 (ref 5.0–8.0)

## 2017-05-09 LAB — CBC
HCT: 45.1 % (ref 39.0–52.0)
Hemoglobin: 15.1 g/dL (ref 13.0–17.0)
MCH: 30.4 pg (ref 26.0–34.0)
MCHC: 33.5 g/dL (ref 30.0–36.0)
MCV: 90.7 fL (ref 78.0–100.0)
Platelets: 244 10*3/uL (ref 150–400)
RBC: 4.97 MIL/uL (ref 4.22–5.81)
RDW: 13.7 % (ref 11.5–15.5)
WBC: 7 10*3/uL (ref 4.0–10.5)

## 2017-05-09 LAB — I-STAT CHEM 8, ED
BUN: 20 mg/dL (ref 6–20)
CALCIUM ION: 1.09 mmol/L — AB (ref 1.15–1.40)
CHLORIDE: 103 mmol/L (ref 101–111)
CREATININE: 1 mg/dL (ref 0.61–1.24)
GLUCOSE: 111 mg/dL — AB (ref 65–99)
HCT: 47 % (ref 39.0–52.0)
Hemoglobin: 16 g/dL (ref 13.0–17.0)
Potassium: 4.1 mmol/L (ref 3.5–5.1)
Sodium: 139 mmol/L (ref 135–145)
TCO2: 22 mmol/L (ref 22–32)

## 2017-05-09 LAB — RAPID URINE DRUG SCREEN, HOSP PERFORMED
AMPHETAMINES: NOT DETECTED
BARBITURATES: NOT DETECTED
BENZODIAZEPINES: NOT DETECTED
COCAINE: POSITIVE — AB
Opiates: NOT DETECTED
Tetrahydrocannabinol: NOT DETECTED

## 2017-05-09 LAB — ETHANOL: Alcohol, Ethyl (B): <10 mg/dL (ref <10)

## 2017-05-09 LAB — I-STAT CG4 LACTIC ACID, ED: Lactic Acid, Venous: 1.99 mmol/L — ABNORMAL HIGH (ref 0.5–1.9)

## 2017-05-09 LAB — SAMPLE TO BLOOD BANK

## 2017-05-09 LAB — CDS SEROLOGY

## 2017-05-09 MED ORDER — HYDROCODONE-ACETAMINOPHEN 5-325 MG PO TABS: 1.0000 | ORAL_TABLET | ORAL | 0 refills | Status: DC | PRN

## 2017-05-09 MED ORDER — IOHEXOL 300 MG/ML  SOLN
100.0000 mL | Freq: Once | INTRAMUSCULAR | Status: AC | PRN
  Administered 2017-05-09: 100 mL via INTRAVENOUS

## 2017-05-09 MED ORDER — AMOXICILLIN 500 MG PO CAPS: 500.0000 mg | ORAL_CAPSULE | Freq: Three times a day (TID) | ORAL | 0 refills | Status: DC

## 2017-05-09 MED ORDER — SODIUM CHLORIDE 0.9 % IV BOLUS
1000.0000 mL | Freq: Once | INTRAVENOUS | Status: AC
  Administered 2017-05-09: 1000 mL via INTRAVENOUS

## 2017-05-09 MED ORDER — FENTANYL CITRATE (PF) 100 MCG/2ML IJ SOLN
100.0000 ug | Freq: Once | INTRAMUSCULAR | Status: AC
  Administered 2017-05-09: 100 ug via INTRAVENOUS
  Filled 2017-05-09: qty 2

## 2017-05-09 MED ORDER — IOPAMIDOL (ISOVUE-M 300) INJECTION 61%: 15.0000 mL | Freq: Once | INTRAMUSCULAR | Status: DC | PRN

## 2017-05-09 MED ORDER — SODIUM CHLORIDE 0.9 % IV SOLN
INTRAVENOUS | Status: DC
  Administered 2017-05-09: 20:00:00 via INTRAVENOUS

## 2017-05-09 MED ORDER — IBUPROFEN 600 MG PO TABS: 600.0000 mg | ORAL_TABLET | Freq: Four times a day (QID) | ORAL | 0 refills | Status: DC | PRN

## 2017-05-09 MED ORDER — LIDOCAINE-EPINEPHRINE (PF) 2 %-1:200000 IJ SOLN
10.0000 mL | Freq: Once | INTRAMUSCULAR | Status: AC
  Administered 2017-05-09: 10 mL
  Filled 2017-05-09: qty 20
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
## 2017-05-09 NOTE — ED Notes (Signed)
Pt still in CT

## 2017-05-09 NOTE — ED Notes (Signed)
Pt transported from xray to CT 

## 2017-05-09 NOTE — ED Provider Notes (Signed)
[facility] EMERGENCY DEPARTMENT Provider Note   CSN: [id number] Arrival date & time: 05/09/17  1919     History   Chief Complaint Chief Complaint  Patient presents with  . Fall    HPI Douglas Bartlett is a 27 y.o. male.  Pt presents to the ED today with a fall of over 20 feet onto the floor.  Pt was at work when this occurred.  He said he tripped.  He did not have a loc, but hit his face.  Pt c/o right elbow, both knee pain.  Pt was given 100 mcg fentanyl IV by EMS en route.  Tetanus is UTD.     History reviewed. No pertinent past medical history.  There are no active problems to display for this patient.   Past Surgical History:  Procedure Laterality Date  . TONSILLECTOMY          Home Medications    Prior to Admission medications   Medication Sig Start Date End Date Taking? Authorizing Provider  amoxicillin (AMOXIL) 500 MG capsule Take 1 capsule (500 mg total) by mouth 3 (three) times daily. 05/09/17   Jacalyn Lefevre, MD  HYDROcodone-acetaminophen (NORCO/VICODIN) 5-325 MG tablet Take 1 tablet by mouth every 4 (four) hours as needed. 05/09/17   Jacalyn Lefevre, MD  ibuprofen (ADVIL,MOTRIN) 600 MG tablet Take 1 tablet (600 mg total) by mouth every 6 (six) hours as needed. 05/09/17   Jacalyn Lefevre, MD    Family History No family history on file.  Social History Social History   Tobacco Use  . Smoking status: Not on file  Substance Use Topics  . Alcohol use: Not on file  . Drug use: Not on file     Allergies   Patient has no known allergies.   Review of Systems Review of Systems  HENT: Positive for dental problem and facial swelling.   Musculoskeletal:       Right elbow, bilateral knee pain  All other systems reviewed and are negative.    Physical Exam Updated Vital Signs BP 133/87   Pulse 98   Temp 99.5 F (37.5 C) (Oral)   Resp 18   Ht  (1.88 m)   Wt 104.3 kg (230 lb)   SpO2 100%   BMI 29.53 kg/m   Physical Exam    Constitutional: He appears well-developed and well-nourished.  HENT:  Head: Normocephalic.  Right Ear: External ear normal.  Left Ear: External ear normal.  Nose: Nose normal.  Chipped teeth #8 and 9.  No pulp seen.  Bite to lower lip with lip contusion.  1/2 cm Lac to upper lip.  Eyes: Pupils are equal, round, and reactive to light. Conjunctivae and EOM are normal.  Neck: Normal range of motion. Neck supple.  Cardiovascular: Normal rate, regular rhythm, normal heart sounds and intact distal pulses.  Pulmonary/Chest: Effort normal and breath sounds normal.  Abdominal: Soft. Bowel sounds are normal.  Musculoskeletal:       Right elbow: He exhibits swelling. Tenderness found.       Right wrist: He exhibits tenderness and swelling.       Right knee: He exhibits swelling. Tenderness found.       Left knee: He exhibits swelling. Tenderness found.  Neurological: He is alert.  Skin: Skin is warm. Capillary refill takes less than 2 seconds.  Psychiatric: He has a normal mood and affect. His behavior is normal. Judgment and thought content normal.  Nursing note and vitals reviewed.  ED Treatments / Results  Labs (all labs ordered are listed, but only abnormal results are displayed) Labs Reviewed  COMPREHENSIVE METABOLIC PANEL - Abnormal; Notable for the following components:      Result Value   CO2 21 (*)    Glucose, Bld 108 (*)    All other components within normal limits  URINALYSIS, ROUTINE W REFLEX MICROSCOPIC - Abnormal; Notable for the following components:   Specific Gravity, Urine 1.038 (*)    Ketones, ur 20 (*)    All other components within normal limits  RAPID URINE DRUG SCREEN, HOSP PERFORMED - Abnormal; Notable for the following components:   Cocaine POSITIVE (*)    All other components within normal limits  I-STAT CHEM 8, ED - Abnormal; Notable for the following components:   Glucose, Bld 111 (*)    Calcium, Ion 1.09 (*)    All other components within normal limits   I-STAT CG4 LACTIC ACID, ED - Abnormal; Notable for the following components:   Lactic Acid, Venous 1.99 (*)    All other components within normal limits  CDS SEROLOGY  CBC  PROTIME-INR  ETHANOL  SAMPLE TO BLOOD BANK    EKG None  Radiology Dg Elbow Complete Right  Result Date: 05/09/2017 CLINICAL DATA:  27 year old male status post 20 foot fall EXAM: RIGHT ELBOW - COMPLETE 3+ VIEW COMPARISON:  None. FINDINGS: Positive elbow joint effusion. There is a nondisplaced fracture through the radial neck and head with slight impaction. Associated surrounding soft tissue swelling. The elbow joint remains located. IMPRESSION: 1. Positive for nondisplaced but slightly impacted fracture through the radial head and neck with associated elbow joint hemarthrosis. Electronically Signed   By: Malachy Moan M.D.   On: 05/09/2017 21:15   Dg Wrist Complete Right  Result Date: 05/09/2017 CLINICAL DATA:  27 year old who fell approximately 20 feet earlier today while working, landing face first. Patient complains of BILATERAL knee pain and RIGHT wrist pain. Initial encounter. EXAM: RIGHT WRIST - COMPLETE 3+ VIEW COMPARISON:  None. FINDINGS: No evidence of acute fracture or dislocation. Joint spaces well preserved. Well-preserved bone mineral density. No intrinsic osseous abnormalities. IMPRESSION: Normal examination. Electronically Signed   By: Hulan Saas M.D.   On: 05/09/2017 21:16   Ct Head Wo Contrast  Result Date: 05/09/2017 CLINICAL DATA:  27 year old male with facial trauma. EXAM: CT HEAD WITHOUT CONTRAST CT MAXILLOFACIAL WITHOUT CONTRAST CT CERVICAL SPINE WITHOUT CONTRAST TECHNIQUE: Multidetector CT imaging of the head, cervical spine, and maxillofacial structures were performed using the standard protocol without intravenous contrast. Multiplanar CT image reconstructions of the cervical spine and maxillofacial structures were also generated. COMPARISON:  None. FINDINGS: CT HEAD FINDINGS Brain: The  ventricles and sulci appropriate size for patient's age. The gray-white matter discrimination is preserved. Subcentimeter hypodense focus inferior to the left lentiform nucleus likely represents a dilated prevascular space. An old lacunar infarct is less likely. There is no acute intracranial hemorrhage. No mass effect or midline shift noted. No extra-axial fluid collection. Vascular: No hyperdense vessel or unexpected calcification. Skull: Normal. Negative for fracture or focal lesion. Other: None CT MAXILLOFACIAL FINDINGS Osseous: There is a depressed fracture of the right lamina Propecia. No other acute fracture identified. No mandibular dislocation. Orbits: The globes and retro-orbital fat are preserved. Sinuses: There is opacification of multiple ethmoid air cells. Multiple small bilateral maxillary sinus and left sphenoid sinus retention cyst or polyps. Soft tissues: Negative. CT CERVICAL SPINE FINDINGS Alignment: Normal. Skull base and vertebrae: No acute fracture. No primary  bone lesion or focal pathologic process. Soft tissues and spinal canal: No prevertebral fluid or swelling. No visible canal hematoma. Disc levels:  No acute findings. Upper chest: Negative. Other: None IMPRESSION: 1. No acute intracranial pathology. 2. No acute/traumatic cervical spine pathology. 3. Depressed fracture of the right lamina papyracea. Electronically Signed   By: Elgie Collard M.D.   On: 05/09/2017 22:11   Ct Chest W Contrast  Result Date: 05/09/2017 CLINICAL DATA:  Initial evaluation for acute trauma, fall. EXAM: CT CHEST, ABDOMEN, AND PELVIS WITH CONTRAST TECHNIQUE: Multidetector CT imaging of the chest, abdomen and pelvis was performed following the standard protocol during bolus administration of intravenous contrast. CONTRAST:  OMNIPAQUE IOHEXOL 300 MG/ML  SOLN COMPARISON:  Prior radiograph from earlier same day. FINDINGS: CT CHEST FINDINGS Cardiovascular: Intrathoracic aorta normal caliber without acute  injury. Visualized great vessels within normal limits. Heart size normal. No pericardial effusion. Limited evaluation of the pulmonary arterial tree unremarkable. Mediastinum/Nodes: Visualized thyroid normal. No enlarged mediastinal, hilar, or axillary lymph nodes. Mild soft tissue density within the anterior mediastinum most consistent with normal residual thymic tissue. Esophagus normal. Lungs/Pleura: Tracheobronchial tree intact and widely patent. Lungs are well inflated and clear. No evidence for contusion or infiltrate. No pulmonary edema or pleural effusion. Subcentimeter nodular density along the right major fissure felt to be most consistent with a normal intra pulmonic lymph node. No concerning pulmonary nodule or mass. No pneumothorax. Musculoskeletal: External soft tissues demonstrate no acute abnormality. No acute osseus abnormality. No worrisome lytic or blastic osseous lesions. CT ABDOMEN PELVIS FINDINGS Hepatobiliary: Liver demonstrates a normal contrast enhanced appearance. Gallbladder normal. No biliary dilatation. Pancreas: Pancreas within normal limits. Spleen: Spleen intact and within normal limits. Adrenals/Urinary Tract: Adrenal glands are normal. Kidneys equal in size with symmetric enhancement. Subcentimeter hypodensity within the left kidney too small the characterize, but statistically likely reflects small cyst. No nephrolithiasis, hydronephrosis, or focal enhancing renal mass. No appreciable hydroureter. Partially distended bladder within normal limits. Stomach/Bowel: Stomach within normal limits. No evidence for bowel obstruction or acute bowel injury. Appendix is normal. No acute inflammatory changes seen about the bowels. Vascular/Lymphatic: Normal intravascular enhancement seen throughout the intra-abdominal aorta and its branch vessels. No adenopathy. Reproductive: Prostate within normal limits. Other: No free air or fluid. No mesenteric or retroperitoneal hematoma. Musculoskeletal:  External soft tissues demonstrate no acute abnormality. No acute osseous abnormality. No worrisome lytic or blastic osseous lesions. IMPRESSION: 1. No CT evidence for acute traumatic injury within the chest, abdomen, and pelvis. 2. No other acute abnormality identified. Electronically Signed   By: Rise Mu M.D.   On: 05/09/2017 22:40   Ct Cervical Spine Wo Contrast  Result Date: 05/09/2017 CLINICAL DATA:  27 year old male with facial trauma. EXAM: CT HEAD WITHOUT CONTRAST CT MAXILLOFACIAL WITHOUT CONTRAST CT CERVICAL SPINE WITHOUT CONTRAST TECHNIQUE: Multidetector CT imaging of the head, cervical spine, and maxillofacial structures were performed using the standard protocol without intravenous contrast. Multiplanar CT image reconstructions of the cervical spine and maxillofacial structures were also generated. COMPARISON:  None. FINDINGS: CT HEAD FINDINGS Brain: The ventricles and sulci appropriate size for patient's age. The gray-white matter discrimination is preserved. Subcentimeter hypodense focus inferior to the left lentiform nucleus likely represents a dilated prevascular space. An old lacunar infarct is less likely. There is no acute intracranial hemorrhage. No mass effect or midline shift noted. No extra-axial fluid collection. Vascular: No hyperdense vessel or unexpected calcification. Skull: Normal. Negative for fracture or focal lesion. Other: None CT MAXILLOFACIAL  FINDINGS Osseous: There is a depressed fracture of the right lamina Propecia. No other acute fracture identified. No mandibular dislocation. Orbits: The globes and retro-orbital fat are preserved. Sinuses: There is opacification of multiple ethmoid air cells. Multiple small bilateral maxillary sinus and left sphenoid sinus retention cyst or polyps. Soft tissues: Negative. CT CERVICAL SPINE FINDINGS Alignment: Normal. Skull base and vertebrae: No acute fracture. No primary bone lesion or focal pathologic process. Soft tissues and  spinal canal: No prevertebral fluid or swelling. No visible canal hematoma. Disc levels:  No acute findings. Upper chest: Negative. Other: None IMPRESSION: 1. No acute intracranial pathology. 2. No acute/traumatic cervical spine pathology. 3. Depressed fracture of the right lamina papyracea. Electronically Signed   By: Elgie Collard M.D.   On: 05/09/2017 22:11   Ct Abdomen Pelvis W Contrast  Result Date: 05/09/2017 CLINICAL DATA:  Initial evaluation for acute trauma, fall. EXAM: CT CHEST, ABDOMEN, AND PELVIS WITH CONTRAST TECHNIQUE: Multidetector CT imaging of the chest, abdomen and pelvis was performed following the standard protocol during bolus administration of intravenous contrast. CONTRAST:  OMNIPAQUE IOHEXOL 300 MG/ML  SOLN COMPARISON:  Prior radiograph from earlier same day. FINDINGS: CT CHEST FINDINGS Cardiovascular: Intrathoracic aorta normal caliber without acute injury. Visualized great vessels within normal limits. Heart size normal. No pericardial effusion. Limited evaluation of the pulmonary arterial tree unremarkable. Mediastinum/Nodes: Visualized thyroid normal. No enlarged mediastinal, hilar, or axillary lymph nodes. Mild soft tissue density within the anterior mediastinum most consistent with normal residual thymic tissue. Esophagus normal. Lungs/Pleura: Tracheobronchial tree intact and widely patent. Lungs are well inflated and clear. No evidence for contusion or infiltrate. No pulmonary edema or pleural effusion. Subcentimeter nodular density along the right major fissure felt to be most consistent with a normal intra pulmonic lymph node. No concerning pulmonary nodule or mass. No pneumothorax. Musculoskeletal: External soft tissues demonstrate no acute abnormality. No acute osseus abnormality. No worrisome lytic or blastic osseous lesions. CT ABDOMEN PELVIS FINDINGS Hepatobiliary: Liver demonstrates a normal contrast enhanced appearance. Gallbladder normal. No biliary dilatation.  Pancreas: Pancreas within normal limits. Spleen: Spleen intact and within normal limits. Adrenals/Urinary Tract: Adrenal glands are normal. Kidneys equal in size with symmetric enhancement. Subcentimeter hypodensity within the left kidney too small the characterize, but statistically likely reflects small cyst. No nephrolithiasis, hydronephrosis, or focal enhancing renal mass. No appreciable hydroureter. Partially distended bladder within normal limits. Stomach/Bowel: Stomach within normal limits. No evidence for bowel obstruction or acute bowel injury. Appendix is normal. No acute inflammatory changes seen about the bowels. Vascular/Lymphatic: Normal intravascular enhancement seen throughout the intra-abdominal aorta and its branch vessels. No adenopathy. Reproductive: Prostate within normal limits. Other: No free air or fluid. No mesenteric or retroperitoneal hematoma. Musculoskeletal: External soft tissues demonstrate no acute abnormality. No acute osseous abnormality. No worrisome lytic or blastic osseous lesions. IMPRESSION: 1. No CT evidence for acute traumatic injury within the chest, abdomen, and pelvis. 2. No other acute abnormality identified. Electronically Signed   By: Rise Mu M.D.   On: 05/09/2017 22:40   Dg Pelvis Portable  Result Date: 05/09/2017 CLINICAL DATA:  Patient who fell approximately 20 feet face-first earlier this evening. Initial encounter. EXAM: PORTABLE PELVIS 1-2 VIEWS COMPARISON:  None. FINDINGS: No evidence of acute fracture. Hip joints intact with well-preserved joint spaces. Sacroiliac joints and symphysis pubis intact without evidence of diastasis. Visualized LOWER lumbar spine intact. IMPRESSION: Normal examination. Electronically Signed   By: Hulan Saas M.D.   On: 05/09/2017 20:16   Dg  Chest Portable 1 View  Result Date: 05/09/2017 CLINICAL DATA:  Patient who fell approximately 20 feet face-first earlier this evening. Initial encounter. EXAM: PORTABLE  CHEST 1 VIEW COMPARISON:  None. FINDINGS: Cardiac silhouette and mediastinal contours normal in appearance for the AP portable technique. Pulmonary parenchyma clear. Bronchovascular markings normal. Pulmonary vascularity normal. No pneumothorax. No visible pleural effusions. IMPRESSION: No acute cardiopulmonary disease. Electronically Signed   By: Hulan Saas M.D.   On: 05/09/2017 20:15   Dg Knee Complete 4 Views Left  Result Date: 05/09/2017 CLINICAL DATA:  27 year old who fell approximately 20 feet earlier today while working, landing face first. Patient complains of BILATERAL knee pain and RIGHT wrist pain. Initial encounter. EXAM: LEFT KNEE - COMPLETE 4+ VIEW COMPARISON:  None. FINDINGS: No evidence of acute fracture or dislocation. Well-preserved joint spaces. Well-preserved bone mineral density. No intrinsic osseous abnormality. No visible joint effusion. IMPRESSION: Normal examination. Electronically Signed   By: Hulan Saas M.D.   On: 05/09/2017 21:16   Dg Knee Complete 4 Views Right  Result Date: 05/09/2017 CLINICAL DATA:  27 year old who fell approximately 20 feet earlier today while working, landing face first. Patient complains of BILATERAL knee pain and RIGHT wrist pain. Initial encounter. EXAM: RIGHT KNEE - COMPLETE 4+ VIEW COMPARISON:  None. FINDINGS: No evidence of acute fracture or dislocation. Well-preserved joint spaces. Well-preserved bone mineral density. No intrinsic osseous abnormality. No visible joint effusion. IMPRESSION: Normal examination. Electronically Signed   By: Hulan Saas M.D.   On: 05/09/2017 21:18   Ct Maxillofacial Wo Contrast  Result Date: 05/09/2017 CLINICAL DATA:  27 year old male with facial trauma. EXAM: CT HEAD WITHOUT CONTRAST CT MAXILLOFACIAL WITHOUT CONTRAST CT CERVICAL SPINE WITHOUT CONTRAST TECHNIQUE: Multidetector CT imaging of the head, cervical spine, and maxillofacial structures were performed using the standard protocol without intravenous  contrast. Multiplanar CT image reconstructions of the cervical spine and maxillofacial structures were also generated. COMPARISON:  None. FINDINGS: CT HEAD FINDINGS Brain: The ventricles and sulci appropriate size for patient's age. The gray-white matter discrimination is preserved. Subcentimeter hypodense focus inferior to the left lentiform nucleus likely represents a dilated prevascular space. An old lacunar infarct is less likely. There is no acute intracranial hemorrhage. No mass effect or midline shift noted. No extra-axial fluid collection. Vascular: No hyperdense vessel or unexpected calcification. Skull: Normal. Negative for fracture or focal lesion. Other: None CT MAXILLOFACIAL FINDINGS Osseous: There is a depressed fracture of the right lamina Propecia. No other acute fracture identified. No mandibular dislocation. Orbits: The globes and retro-orbital fat are preserved. Sinuses: There is opacification of multiple ethmoid air cells. Multiple small bilateral maxillary sinus and left sphenoid sinus retention cyst or polyps. Soft tissues: Negative. CT CERVICAL SPINE FINDINGS Alignment: Normal. Skull base and vertebrae: No acute fracture. No primary bone lesion or focal pathologic process. Soft tissues and spinal canal: No prevertebral fluid or swelling. No visible canal hematoma. Disc levels:  No acute findings. Upper chest: Negative. Other: None IMPRESSION: 1. No acute intracranial pathology. 2. No acute/traumatic cervical spine pathology. 3. Depressed fracture of the right lamina papyracea. Electronically Signed   By: Elgie Collard M.D.   On: 05/09/2017 22:11    Procedures .Marland KitchenLaceration Repair Date/Time: 05/09/2017 11:11 PM Performed by: Jacalyn Lefevre, MD Authorized by: Jacalyn Lefevre, MD   Consent:    Consent obtained:  Verbal   Consent given by:  Patient   Risks discussed:  Infection and pain   Alternatives discussed:  No treatment Anesthesia (see MAR for exact dosages):  Anesthesia  method:  Nerve block   Block needle gauge:  27 G   Block anesthetic:  Lidocaine 2% WITH epi   Block technique:  Infraorbital block   Block injection procedure:  Anatomic landmarks identified   Block outcome:  Anesthesia achieved Laceration details:    Location:  Lip   Lip location:  Upper interior lip   Length (cm):  0.5 Repair type:    Repair type:  Simple Pre-procedure details:    Preparation:  Patient was prepped and draped in usual sterile fashion Exploration:    Contaminated: no   Treatment:    Area cleansed with:  Saline   Amount of cleaning:  Standard   Irrigation solution:  Sterile saline Skin repair:    Repair method:  Sutures   Suture size:  4-0   Suture material:  Chromic gut   Suture technique:  Simple interrupted   Number of sutures:  2 Approximation:    Approximation:  Close   Vermilion border: well-aligned   Post-procedure details:    Dressing:  Open (no dressing)   (including critical care time)  Medications Ordered in ED Medications  sodium chloride 0.9 % bolus 1,000 mL (0 mLs Intravenous Stopped 05/09/17 2303)    And  0.9 %  sodium chloride infusion ( Intravenous New Bag/Given 05/09/17 1958)  iopamidol (ISOVUE-M) 61 % intrathecal injection 15 mL (has no administration in time range)  iohexol (OMNIPAQUE) 300 MG/ML solution 100 mL (100 mLs Intravenous Contrast Given 05/09/17 2118)  fentaNYL (SUBLIMAZE) injection 100 mcg (100 mcg Intravenous Given 05/09/17 2221)  lidocaine-EPINEPHrine (XYLOCAINE W/EPI) 2 %-1:200000 (PF) injection 10 mL (10 mLs Infiltration Given 05/09/17 2300)     Initial Impression / Assessment and Plan / ED Course  I have reviewed the triage vital signs and the nursing notes.  Pertinent labs & imaging results that were available during my care of the patient were reviewed by me and considered in my medical decision making (see chart for details).   Ms has remained clear. Pt was able to ambulate without difficulties.  Pt is stable for d/c.   He will need to f/u with ENT, ortho, and with his dentist.  Return if worse.  Final Clinical Impressions(s) / ED Diagnoses   Final diagnoses:  Fall, initial encounter  Closed fracture of tooth, initial encounter  Closed nondisplaced fracture of head of right radius, initial encounter  Closed fracture of orbital plate of ethmoid bone, initial encounter [facility])    ED Discharge Orders        Ordered    amoxicillin (AMOXIL) 500 MG capsule  3 times daily     05/09/17 2349    HYDROcodone-acetaminophen (NORCO/VICODIN) 5-325 MG tablet  Every 4 hours PRN     05/09/17 2349    ibuprofen (ADVIL,MOTRIN) 600 MG tablet  Every 6 hours PRN     05/09/17 2349       Jacalyn Lefevre, MD 05/09/17 2350

## 2017-05-10 ENCOUNTER — Encounter: Payer: Self-pay | Admitting: Internal Medicine

## 2017-05-10 NOTE — ED Notes (Signed)
Patient was given a Arm IMMOB. MED.

## 2017-05-10 NOTE — Progress Notes (Signed)
Chaplain responded to a level two trauma for a 20 foot fall of a patient who fell at work from [employer]. The patient was being accessed by the medical staff upon arrival to the ED. On follow up the patient was alert, and had family with him. He was appreciative of the support. Chaplain will follow up as needed. Chaplain Janell Quiet [phone]

## 2017-07-06 ENCOUNTER — Ambulatory Visit: Payer: Self-pay | Admitting: Internal Medicine

## 2017-08-29 ENCOUNTER — Ambulatory Visit: Payer: Self-pay | Admitting: Internal Medicine

## 2017-10-10 ENCOUNTER — Ambulatory Visit: Payer: Self-pay | Admitting: Internal Medicine

## 2018-04-24 ENCOUNTER — Telehealth: Payer: Self-pay

## 2018-04-24 ENCOUNTER — Other Ambulatory Visit: Payer: Self-pay | Admitting: Internal Medicine

## 2018-04-24 NOTE — Telephone Encounter (Signed)
Attempted to call patient to schedule an appointment for lab work/office visit. Left voicemail requesting patient call office to schedule appointment. Will send mychart message as well. Lorenso Courier, [place]

## 2019-04-12 IMAGING — CT CT HEAD W/O CM
4 of 10 series · 15 of 47 positions shown, 17 images · non-contrast
Comparison: None.

CLINICAL DATA: 27-year-old male with facial trauma.

EXAM:
CT HEAD WITHOUT CONTRAST
CT MAXILLOFACIAL WITHOUT CONTRAST
CT CERVICAL SPINE WITHOUT CONTRAST
TECHNIQUE: Multidetector CT imaging of the head, cervical spine, and
maxillofacial structures were performed using the standard protocol
without intravenous contrast. Multiplanar CT image reconstructions
of the cervical spine and maxillofacial structures were also
generated.

[Series 5: head 3.0 mpr cor · coronal · 0.36mm/px · 1 of 71 slices shown]
[im 36/71  brain]
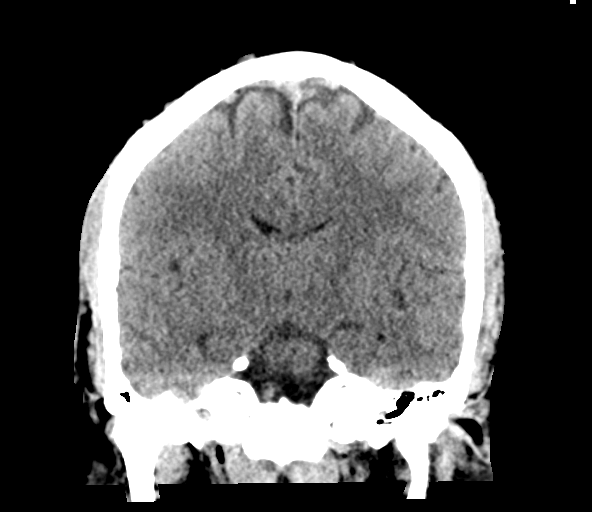

[Series 7: facial/ orbits 2.0 h30s · axial · 0.36mm/px · z∈[-206,-96]mm · 6 of 79 slices shown]
[im 12/79  brain]
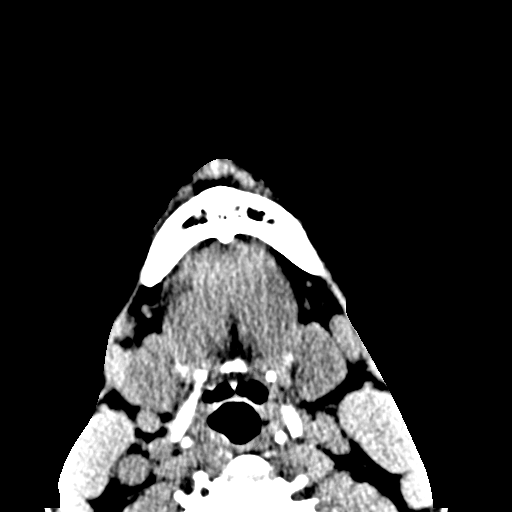
[im 23/79  brain]
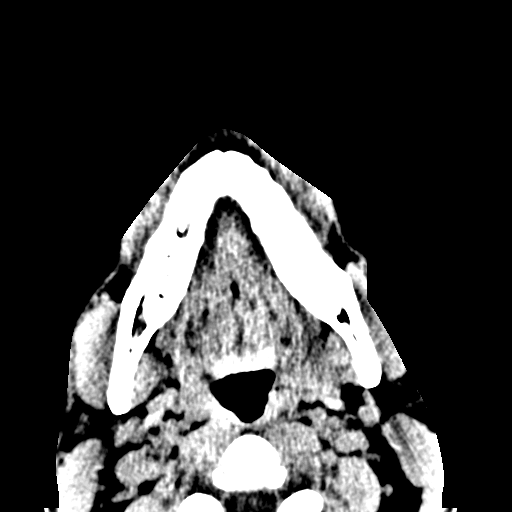
[im 34/79  brain]
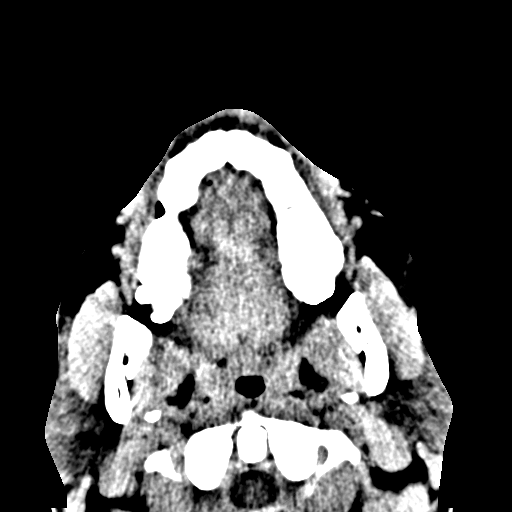
[im 45/79  brain]
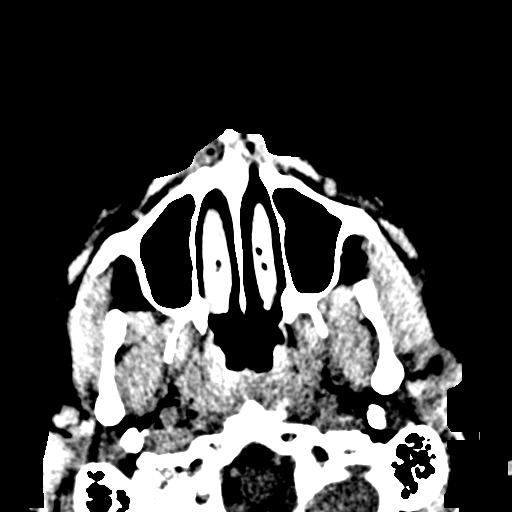
[im 56/79  brain]
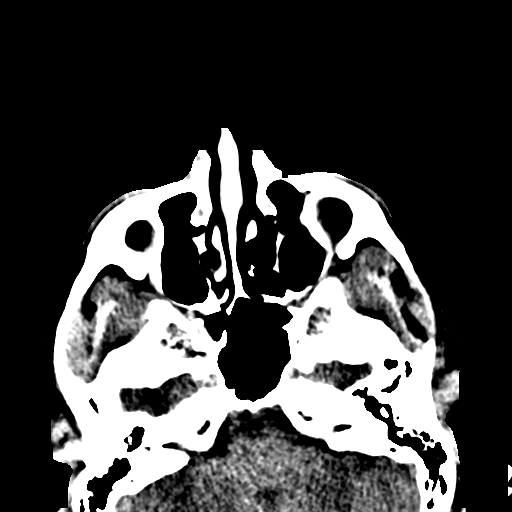
[im 67/79  brain]
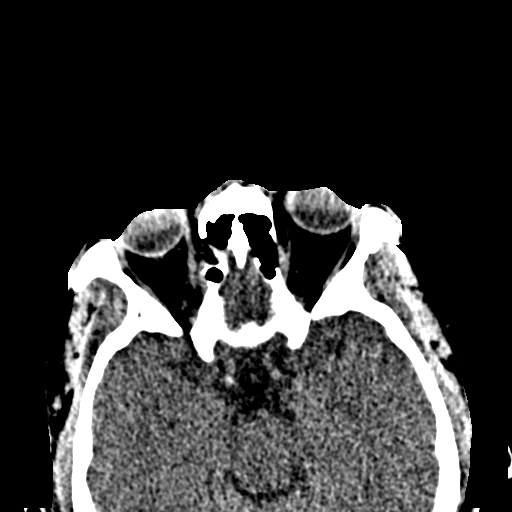

[Series 12: sagittal soft tissue · sagittal · 0.31mm/px · 1 of 82 slices shown]
[im 41/82  brain]
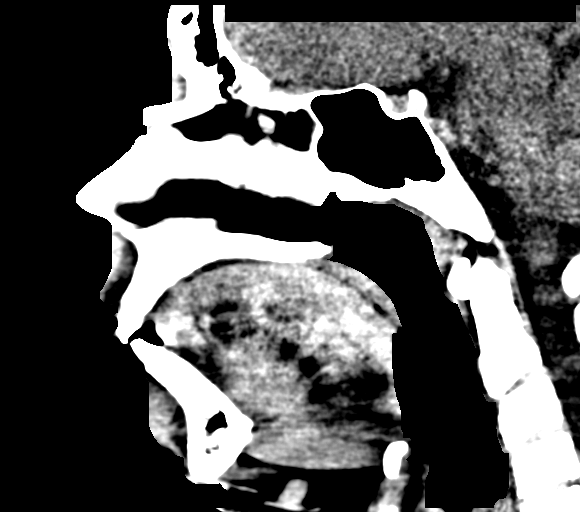

[Series 21: orthogonal axials st · axial · 0.21mm/px · z∈[-285,-164]mm · 7 of 85 slices shown, 9 images]
[im 11/85  brain]
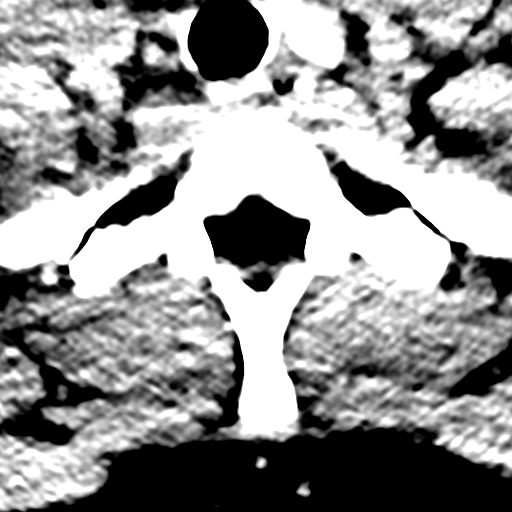
[im 11/85  bone]
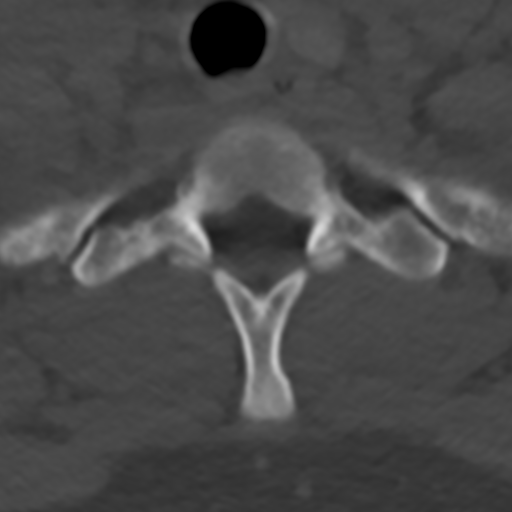
[im 22/85  brain]
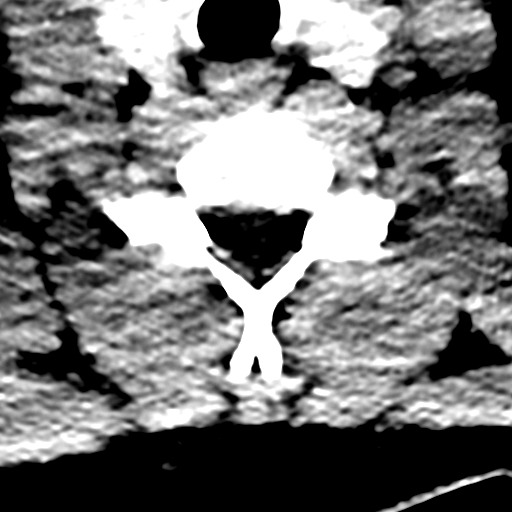
[im 32/85  brain]
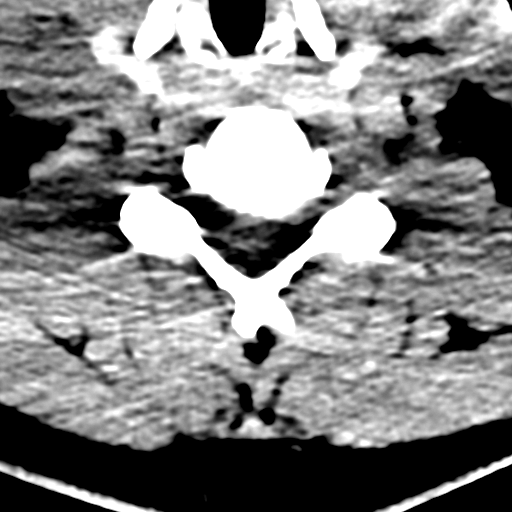
[im 43/85  brain]
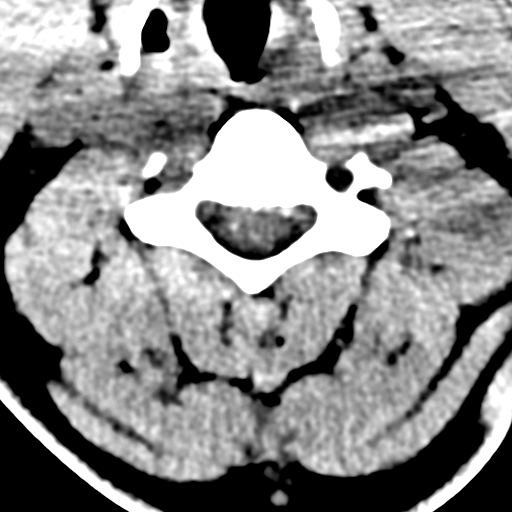
[im 53/85  brain]
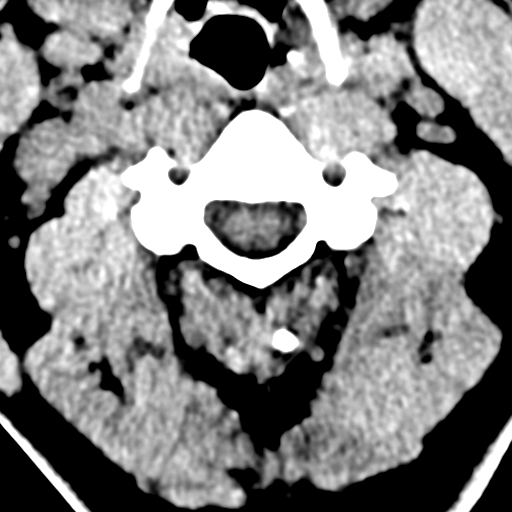
[im 53/85  bone]
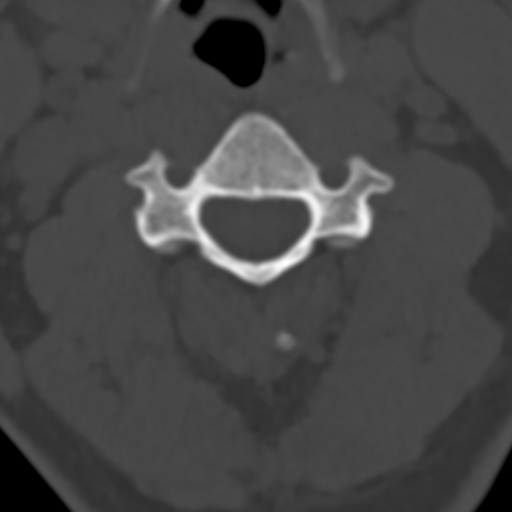
[im 64/85  brain]
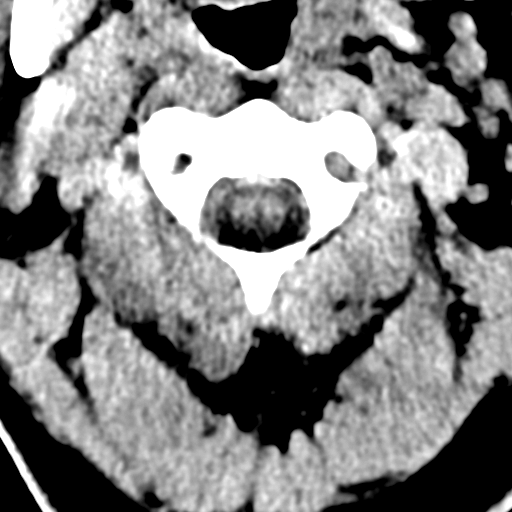
[im 74/85  brain]
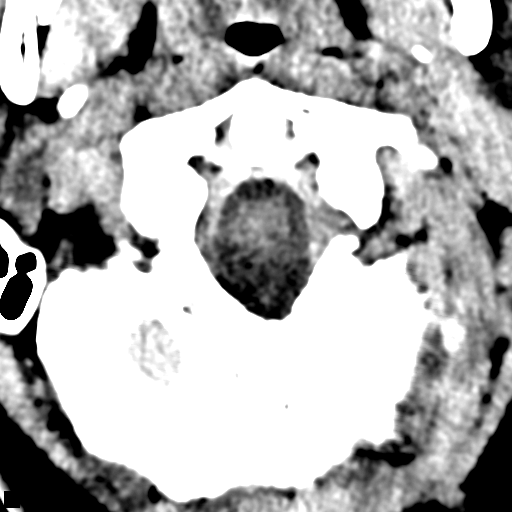

[15 of 47 positions shown; findings below may reference images not displayed]

FINDINGS: CT HEAD FINDINGS

Brain: The ventricles and sulci appropriate size for patient's age.
The gray-white matter discrimination is preserved. Subcentimeter
hypodense focus inferior to the left lentiform nucleus likely
represents a dilated prevascular space. An old lacunar infarct is
less likely. There is no acute intracranial hemorrhage. No mass
effect or midline shift noted. No extra-axial fluid collection.

Vascular: No hyperdense vessel or unexpected calcification.

Skull: Normal. Negative for fracture or focal lesion.

Other: None

CT MAXILLOFACIAL FINDINGS

Osseous: There is a depressed fracture of the right lamina Propecia.
No other acute fracture identified. No mandibular dislocation.

Orbits: The globes and retro-orbital fat are preserved.

Sinuses: There is opacification of multiple ethmoid air cells.
Multiple small bilateral maxillary sinus and left sphenoid sinus
retention cyst or polyps.

Soft tissues: Negative.

CT CERVICAL SPINE FINDINGS

Alignment: Normal.

Skull base and vertebrae: No acute fracture. No primary bone lesion
or focal pathologic process.

Soft tissues and spinal canal: No prevertebral fluid or swelling. No
visible canal hematoma.

Disc levels:  No acute findings.

Upper chest: Negative.

Other: None
IMPRESSION: 1. No acute intracranial pathology.
2. No acute/traumatic cervical spine pathology.
3. Depressed fracture of the right lamina papyracea.

## 2019-04-12 IMAGING — CR DG KNEE COMPLETE 4+V*L*
4 series · 4 of 4 positions shown · non-contrast
Comparison: None.

CLINICAL DATA: 27-year-old who fell approximately 20 feet earlier
today while working, landing face first. Patient complains of
BILATERAL knee pain and RIGHT wrist pain. Initial encounter.

EXAM:
LEFT KNEE - COMPLETE 4+ VIEW

[knee ap]
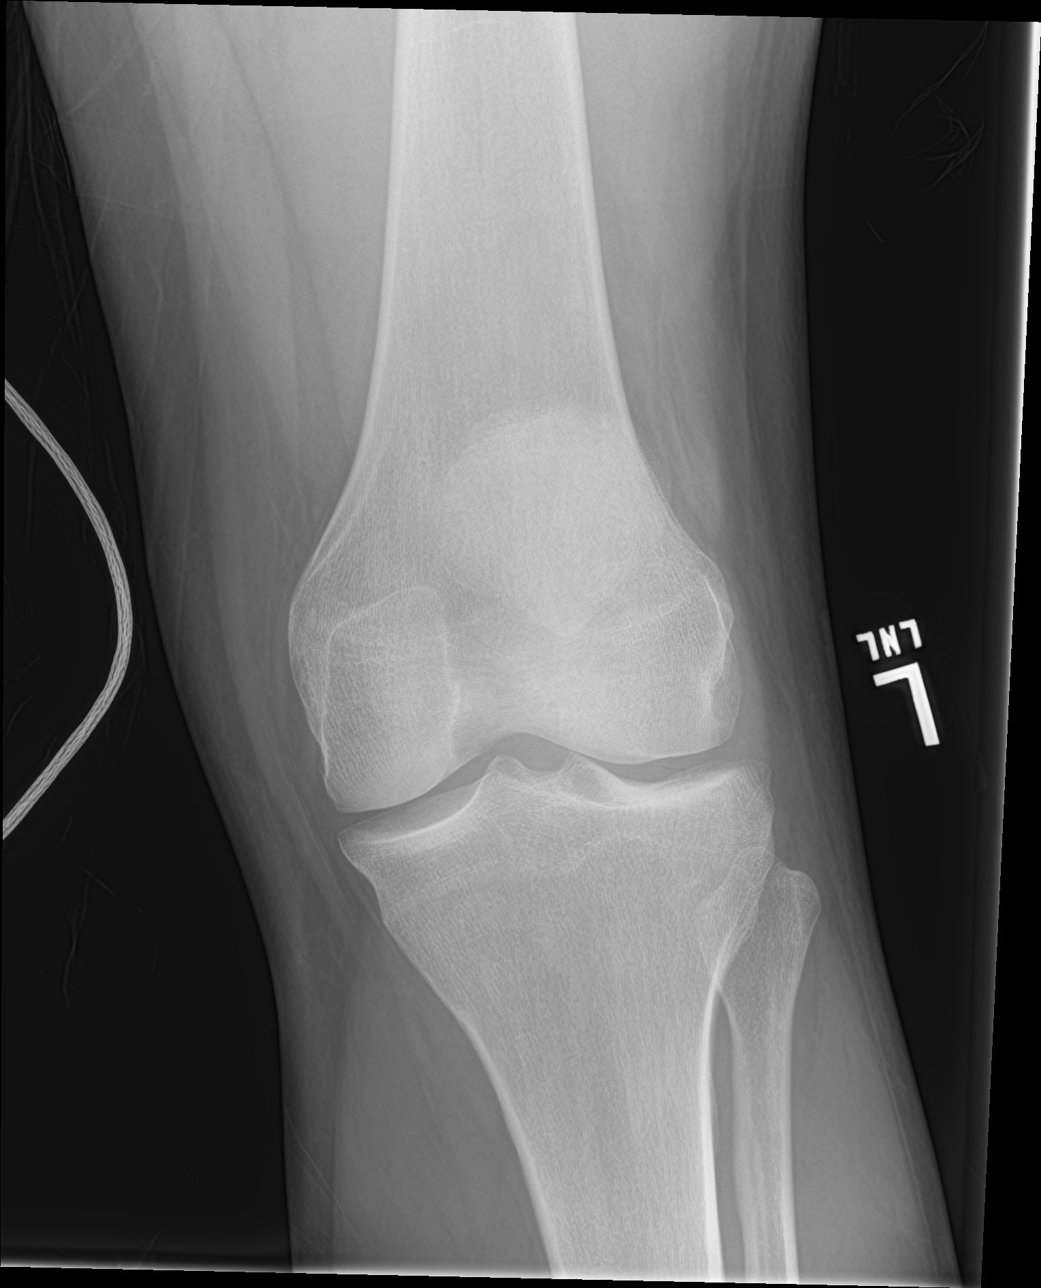

[knee lat]
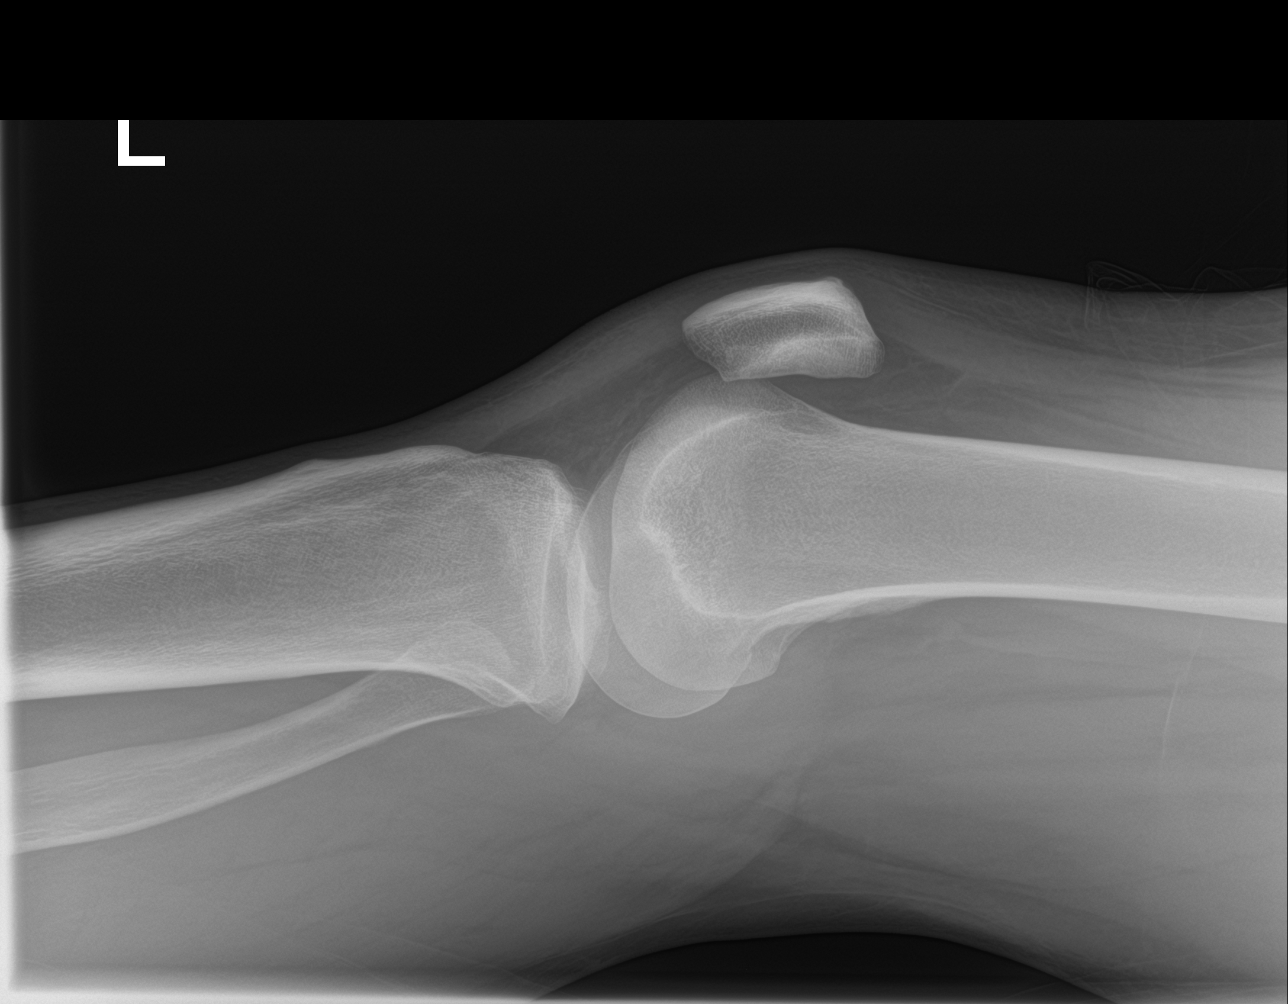

[knee obl (1 of 2)]
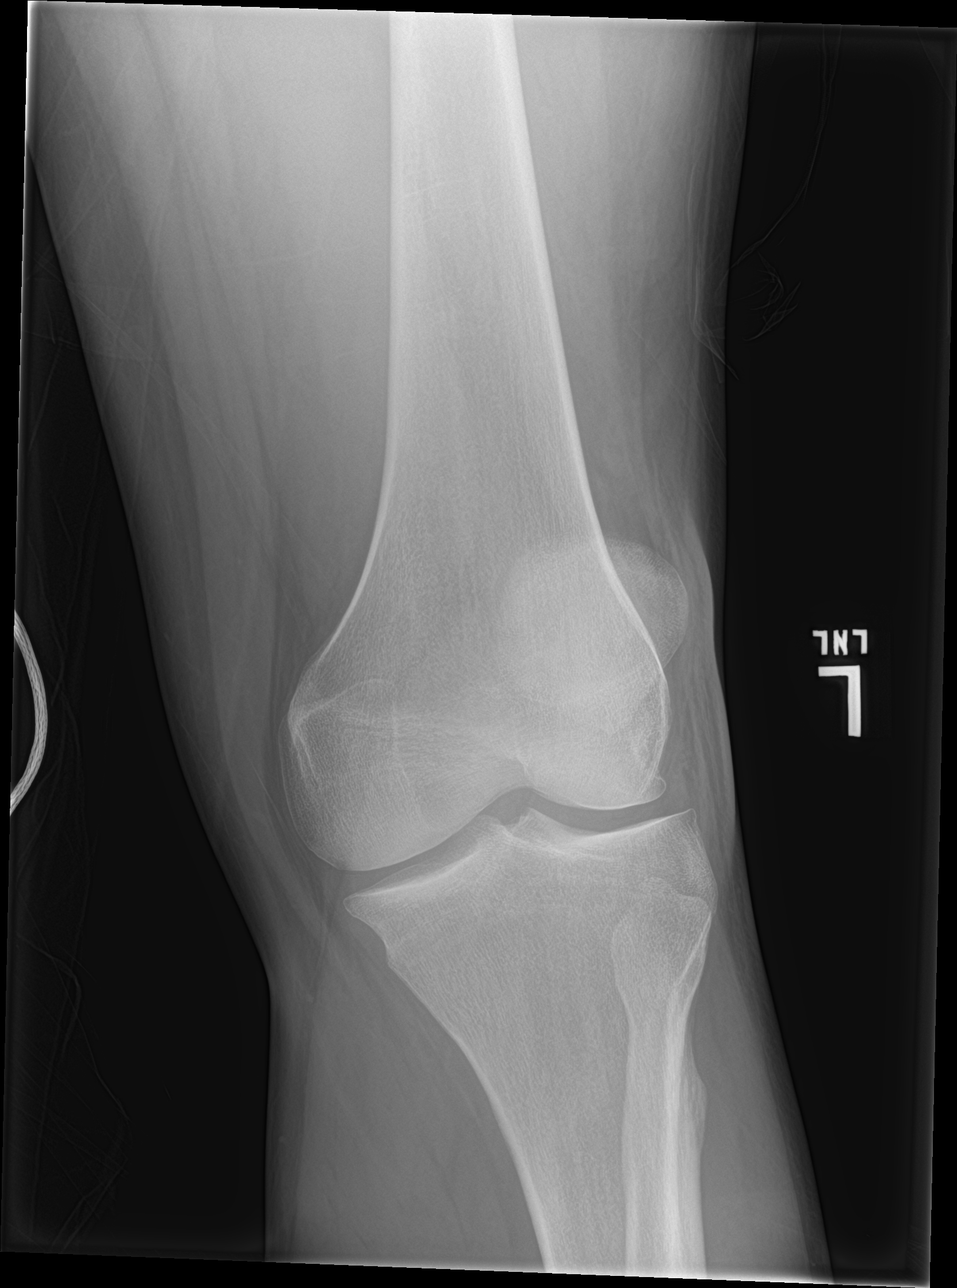

[knee obl (2 of 2)]
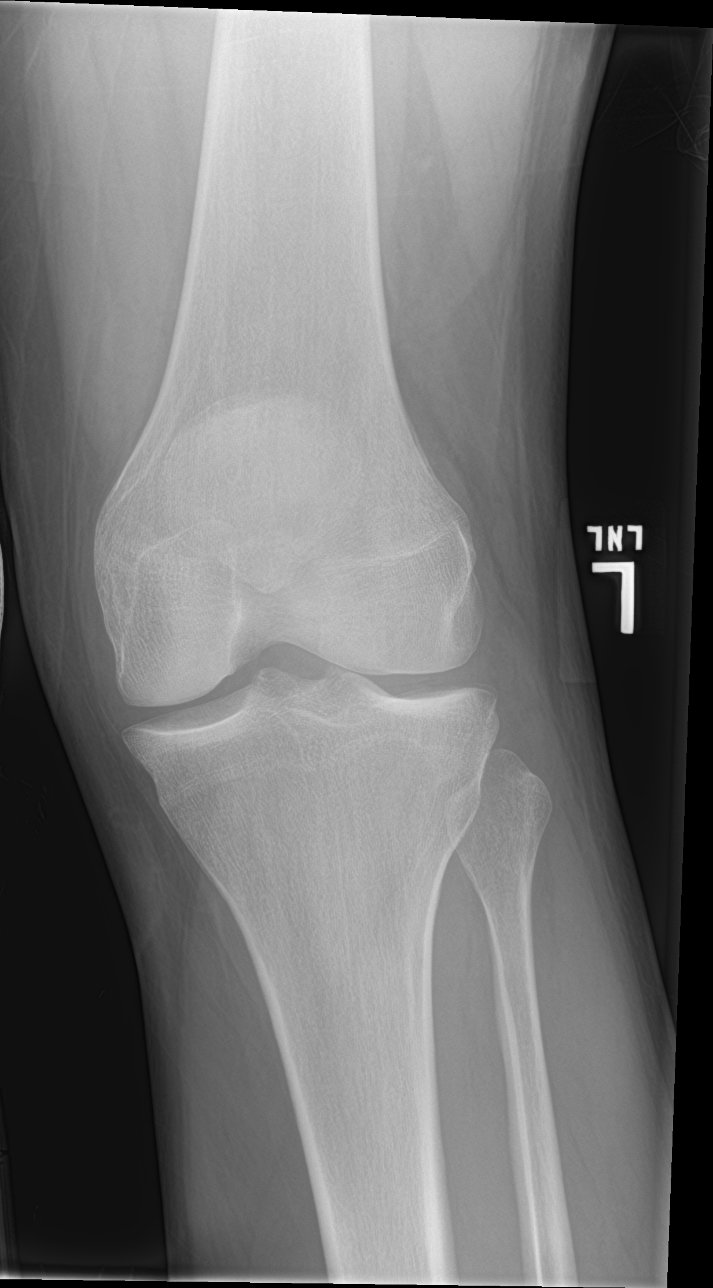

[4 of 4 positions shown; findings below may reference images not displayed]

FINDINGS: No evidence of acute fracture or dislocation. Well-preserved joint
spaces. Well-preserved bone mineral density. No intrinsic osseous
abnormality. No visible joint effusion.
IMPRESSION: Normal examination.

## 2020-05-16 ENCOUNTER — Ambulatory Visit: Payer: Self-pay | Admitting: Internal Medicine

## 2020-05-23 ENCOUNTER — Ambulatory Visit: Payer: Self-pay

## 2020-05-23 ENCOUNTER — Ambulatory Visit (INDEPENDENT_AMBULATORY_CARE_PROVIDER_SITE_OTHER): Payer: Self-pay | Admitting: Internal Medicine

## 2020-05-23 ENCOUNTER — Encounter: Payer: Self-pay | Admitting: Internal Medicine

## 2020-05-23 ENCOUNTER — Telehealth: Payer: Self-pay | Admitting: Pharmacist

## 2020-05-23 ENCOUNTER — Encounter: Payer: Self-pay | Admitting: Family

## 2020-05-23 ENCOUNTER — Other Ambulatory Visit: Payer: Self-pay

## 2020-05-23 VITALS — BP 132/81 | HR 82 | Temp 98.1°F | Wt 183.0 lb

## 2020-05-23 DIAGNOSIS — Z23 Encounter for immunization: Secondary | ICD-10-CM

## 2020-05-23 DIAGNOSIS — Z113 Encounter for screening for infections with a predominantly sexual mode of transmission: Secondary | ICD-10-CM

## 2020-05-23 DIAGNOSIS — B20 Human immunodeficiency virus [HIV] disease: Secondary | ICD-10-CM

## 2020-05-23 DIAGNOSIS — Z79899 Other long term (current) drug therapy: Secondary | ICD-10-CM

## 2020-05-23 MED ORDER — BICTEGRAVIR-EMTRICITAB-TENOFOV 50-200-25 MG PO TABS
1.0000 | ORAL_TABLET | Freq: Every day | ORAL | 1 refills | Status: DC
Start: 2020-05-23 — End: 2020-06-26

## 2020-05-23 NOTE — Telephone Encounter (Signed)
I was able to get patient a free 30 day supply of Biktarvy from [facility]. Gave information to patient to take to [facility]. He will call with any issues.

## 2020-05-26 ENCOUNTER — Encounter: Payer: Self-pay | Admitting: Internal Medicine

## 2020-05-26 LAB — CYTOLOGY, (ORAL, ANAL, URETHRAL) ANCILLARY ONLY
Chlamydia: NEGATIVE
Chlamydia: NEGATIVE
Comment: NEGATIVE
Comment: NEGATIVE
Comment: NORMAL
Comment: NORMAL
Neisseria Gonorrhea: NEGATIVE
Neisseria Gonorrhea: NEGATIVE

## 2020-05-26 LAB — URINE CYTOLOGY ANCILLARY ONLY
Chlamydia: NEGATIVE
Comment: NEGATIVE
Comment: NORMAL
Neisseria Gonorrhea: NEGATIVE

## 2020-05-26 NOTE — Progress Notes (Signed)
Patient ID: Douglas Bartlett, male    DOB: 25-Jun-1990, 30 y.o.   MRN: [id number]  Reason for visit: to establish care as a new patient with HIV  HPI:   Patient was first diagnosed in 2018.  He was tested as part risk factor screening.  He started care at that time and started on Triumeq.  He though has been out of care over 3 years with multiple family issues but feels he is in a good place to get back into care now. He endorses MSM.  He has a stable partner who is positive.  He has had no issues with weight loss, diarrhea, n/v.  No rash or sob.   Past Medical History:  Diagnosis Date  . Chicken pox   HIV  Prior to Admission medications   Medication Sig Start Date End Date Taking? Authorizing Provider  bictegravir-emtricitabine-tenofovir AF (BIKTARVY) 50-200-25 MG TABS tablet Take 1 tablet by mouth daily. 05/23/20  Yes Brooklee Michelin, Belia Heman, MD  fluticasone (FLONASE) 50 MCG/ACT nasal spray Place 2 sprays into both nostrils daily. 10/26/16  Yes Muthersbaugh, Dahlia Client, PA-C    No Known Allergies  Social History   Tobacco Use  . Smoking status: Current Some Day Smoker    Types: Cigars    Start date: 01/05/2011  . Smokeless tobacco: Never Used  Substance Use Topics  . Alcohol use: Yes    Comment: weekends liquor  . Drug use: No    Family History  Problem Relation Age of Onset  . Hypertension Mother   . Healthy Father      Review of Systems Constitutional: negative for fevers and chills Respiratory: negative for cough or sputum Gastrointestinal: negative for nausea and diarrhea Integument/breast: negative for rash Musculoskeletal: negative for myalgias and arthralgias All other systems reviewed and are negative    CONSTITUTIONAL:in no apparent distress  Vitals:   05/23/20 0958  BP: 132/81  Pulse: 82  Temp: 98.1 F (36.7 C)   EYES: anicteric HENT: no thrush CARD:Cor RRR RESP:clear BB:CWUGQ sounds are normal, liver is not enlarged, spleen is not enlarged MS:no pedal edema  noted SKIN:no rash NEURO: non-focal  Lab Results  Component Value Date   HIV1RNAQUANT 36 (H) 01/06/2017   HIV1RNAQUANT 23 (H) 07/06/2016   HIV1RNAQUANT 90 (H) 04/06/2016   No components found for: HIV1GENOTYPRPLUS No components found for: THELPERCELL  Assessment: here to reestablish care for HIV.  No labs prior to the visit.  Discussed with patient treatment options and side effects, benefits of treatment, long term outcomes.  I discussed the severity of untreated HIV including higher cancer risk, opportunistic infections, renal failure.  Also discussed needing to use condoms, partner disclosure, necessary vaccines, blood monitoring.  All questions answered.   Labs reviewed after the visit and CD4 of 290.  Plan: 1) Biktarvy - provided for him 2) Pneumococcal 13 vaccine 3) STI screening Follow up in about 4 weeks and will recheck labs on [facility]

## 2020-06-09 LAB — COMPLETE METABOLIC PANEL WITH GFR
AG Ratio: 1.3 (calc) (ref 1.0–2.5)
ALT: 17 U/L (ref 9–46)
AST: 20 U/L (ref 10–40)
Albumin: 4.5 g/dL (ref 3.6–5.1)
Alkaline phosphatase (APISO): 55 U/L (ref 36–130)
BUN: 9 mg/dL (ref 7–25)
CO2: 29 mmol/L (ref 20–32)
Calcium: 9.6 mg/dL (ref 8.6–10.3)
Chloride: 105 mmol/L (ref 98–110)
Creat: 0.97 mg/dL (ref 0.60–1.35)
GFR, Est African American: 121 mL/min/{1.73_m2} (ref >=60)
GFR, Est Non African American: 104 mL/min/{1.73_m2} (ref >=60)
Globulin: 3.6 g/dL (calc) (ref 1.9–3.7)
Glucose, Bld: 100 mg/dL — ABNORMAL HIGH (ref 65–99)
Potassium: 4 mmol/L (ref 3.5–5.3)
Sodium: 141 mmol/L (ref 135–146)
Total Bilirubin: 0.6 mg/dL (ref 0.2–1.2)
Total Protein: 8.1 g/dL (ref 6.1–8.1)

## 2020-06-09 LAB — LIPID PANEL
Cholesterol: 136 mg/dL (ref <200)
HDL: 43 mg/dL (ref >=40)
LDL Cholesterol (Calc): 80 mg/dL (calc)
Non-HDL Cholesterol (Calc): 93 mg/dL (calc) (ref <130)
Total CHOL/HDL Ratio: 3.2 (calc) (ref <5.0)
Triglycerides: 51 mg/dL (ref <150)

## 2020-06-09 LAB — T-HELPER CELLS (CD4) COUNT (NOT AT ARMC)
Absolute CD4: 290 cells/uL — ABNORMAL LOW (ref 490–1740)
CD4 T Helper %: 21 % — ABNORMAL LOW (ref 30–61)
Total lymphocyte count: 1379 cells/uL (ref 850–3900)

## 2020-06-09 LAB — RPR: RPR Ser Ql: NONREACTIVE

## 2020-06-09 LAB — CBC WITH DIFFERENTIAL/PLATELET
Absolute Monocytes: 397 cells/uL (ref 200–950)
Basophils Absolute: 11 cells/uL (ref 0–200)
Basophils Relative: 0.4 %
Eosinophils Absolute: 30 cells/uL (ref 15–500)
Eosinophils Relative: 1.1 %
HCT: 46.2 % (ref 38.5–50.0)
Hemoglobin: 15.1 g/dL (ref 13.2–17.1)
Lymphs Abs: 1326 cells/uL (ref 850–3900)
MCH: 28.8 pg (ref 27.0–33.0)
MCHC: 32.7 g/dL (ref 32.0–36.0)
MCV: 88.2 fL (ref 80.0–100.0)
MPV: 10.2 fL (ref 7.5–12.5)
Monocytes Relative: 14.7 %
Neutro Abs: 937 cells/uL — ABNORMAL LOW (ref 1500–7800)
Neutrophils Relative %: 34.7 %
Platelets: 223 10*3/uL (ref 140–400)
RBC: 5.24 10*6/uL (ref 4.20–5.80)
RDW: 12.8 % (ref 11.0–15.0)
Total Lymphocyte: 49.1 %
WBC: 2.7 10*3/uL — ABNORMAL LOW (ref 3.8–10.8)

## 2020-06-09 LAB — HIV-1 GENOTYPE: HIV-1 Genotype: DETECTED — AB

## 2020-06-09 LAB — HIV-1 RNA ULTRAQUANT REFLEX TO GENTYP+
HIV 1 RNA Quant: 8050 copies/mL — ABNORMAL HIGH
HIV-1 RNA Quant, Log: 3.91 Log copies/mL — ABNORMAL HIGH

## 2020-06-26 ENCOUNTER — Encounter: Payer: Self-pay | Admitting: Family

## 2020-06-26 ENCOUNTER — Other Ambulatory Visit: Payer: Self-pay

## 2020-06-26 ENCOUNTER — Ambulatory Visit (INDEPENDENT_AMBULATORY_CARE_PROVIDER_SITE_OTHER): Payer: Self-pay | Admitting: Family

## 2020-06-26 VITALS — BP 144/98 | HR 70 | Temp 97.6°F | Wt 184.0 lb

## 2020-06-26 DIAGNOSIS — B20 Human immunodeficiency virus [HIV] disease: Secondary | ICD-10-CM

## 2020-06-26 DIAGNOSIS — Z0000 Encounter for general adult medical examination without abnormal findings: Secondary | ICD-10-CM

## 2020-06-26 MED ORDER — BICTEGRAVIR-EMTRICITAB-TENOFOV 50-200-25 MG PO TABS: 1.0000 | ORAL_TABLET | Freq: Every day | ORAL | 3 refills | Status: DC

## 2020-06-26 NOTE — Progress Notes (Signed)
Brief Narrative   Patient ID: Douglas Bartlett, male    DOB: 08-05-1990, 30 y.o.   MRN: [id number]  [name] is a 30 y/o AA male diagnosed with HIV disease in January 2018 with risk factor of MSM. Initial viral load of 41,259 with CD4 count of 340. Genotype with no significant resistance. Entered care at [facility] Stage 2. No opportunistic infection. [id number] negative. ART regimen of Triumeq and Biktarvy.   Subjective:    Chief Complaint  Patient presents with   Follow-up    4 week follow up      HPI:  Douglas Bartlett is a 30 y.o. male with HIV disease last seen on 5/20 by Dr. Luciana Axe to re-establish care after last being seen in January 2019. Restarted on Biktarvy. Viral load found to be 8,050 with CD4 count of 290. Genotype with Subtype B and no significant resistance patterns. Here today for 1 month follow up.   [name] has been taking his Biktarvy daily as prescribed with no adverse side effects or missed doses since his last office visit.  Overall feeling well today with no new concerns/complaints. Denies fevers, chills, night sweats, headaches, changes in vision, neck pain/stiffness, nausea, diarrhea, vomiting, lesions or rashes.  [name] has no problems obtaining medication from pharmacy and is now covered through [employer].  Denies feelings of being down, depressed, or hopeless recently.  No recreational or illicit drug use.  He vapes daily and drinks alcohol on occasion.  Condoms offered and declined.  Due for routine dental care.  No Known Allergies    Outpatient Medications Prior to Visit  Medication Sig Dispense Refill   fluticasone (FLONASE) 50 MCG/ACT nasal spray Place 2 sprays into both nostrils daily. 9.9 g 2   bictegravir-emtricitabine-tenofovir AF (BIKTARVY) 50-200-25 MG TABS tablet Take 1 tablet by mouth daily. 30 tablet 1   No facility-administered medications prior to visit.     Past Medical History:  Diagnosis Date   Chicken pox      Past Surgical History:   Procedure Laterality Date   TONSILLECTOMY         Review of Systems  Constitutional:  Negative for appetite change, chills, fatigue, fever and unexpected weight change.  Eyes:  Negative for visual disturbance.  Respiratory:  Negative for cough, chest tightness, shortness of breath and wheezing.   Cardiovascular:  Negative for chest pain and leg swelling.  Gastrointestinal:  Negative for abdominal pain, constipation, diarrhea, nausea and vomiting.  Genitourinary:  Negative for dysuria, flank pain, frequency, genital sores, hematuria and urgency.  Skin:  Negative for rash.  Allergic/Immunologic: Negative for immunocompromised state.  Neurological:  Negative for dizziness and headaches.     Objective:    BP (!) 144/98   Pulse 70   Temp 97.6 F (36.4 C)   Wt 184 lb (83.5 kg)   SpO2 100%   BMI 23.62 kg/m  Nursing note and vital signs reviewed.  Physical Exam Constitutional:      General: He is not in acute distress.    Appearance: He is well-developed.  Eyes:     Conjunctiva/sclera: Conjunctivae normal.  Cardiovascular:     Rate and Rhythm: Normal rate and regular rhythm.     Heart sounds: Normal heart sounds. No murmur heard.   No friction rub. No gallop.  Pulmonary:     Effort: Pulmonary effort is normal. No respiratory distress.     Breath sounds: Normal breath sounds. No wheezing or rales.  Chest:  Chest wall: No tenderness.  Abdominal:     General: Bowel sounds are normal.     Palpations: Abdomen is soft.     Tenderness: There is no abdominal tenderness.  Musculoskeletal:     Cervical back: Neck supple.  Lymphadenopathy:     Cervical: No cervical adenopathy.  Skin:    General: Skin is warm and dry.     Findings: No rash.  Neurological:     Mental Status: He is alert and oriented to person, place, and time.  Psychiatric:        Behavior: Behavior normal.        Thought Content: Thought content normal.        Judgment: Judgment normal.      Depression screen [facility] 2/9 06/26/2020 01/06/2017 07/06/2016 04/06/2016 02/09/2016  Decreased Interest 0 0 0 0 0  Down, Depressed, Hopeless 0 0 0 0 0  PHQ - 2 Score 0 0 0 0 0       Assessment & Plan:    Patient Active Problem List   Diagnosis Date Noted   Healthcare maintenance 06/26/2020   Screening examination for venereal disease 01/06/2017   Encounter for long-term (current) use of high-risk medication 01/06/2017   Need for prophylactic vaccination against Streptococcus pneumoniae (pneumococcus) 07/06/2016   Medication monitoring encounter 04/06/2016   Human immunodeficiency virus I infection (HCC) 01/15/2016   Routine general medical examination at a health care facility 07/30/2015   Rash and nonspecific skin eruption 05/26/2015   Syncopal episodes 11/12/2014     Problem List Items Addressed This Visit       Other   Human immunodeficiency virus I infection (HCC) - Primary    Douglas Bartlett has improved adherence and good tolerance to his ART regimen Biktarvy.  No signs/symptoms of opportunistic infection or progressive HIV.  We reviewed lab work and discussed plan of care.  Emphasized importance of taking medications daily as prescribed.  Check blood work today.  Continue current dose of Biktarvy.  Renew financial assistance in 1 month.  Plan for follow-up in 1 month or sooner if needed with lab work on the same day.       Relevant Medications   bictegravir-emtricitabine-tenofovir AF (BIKTARVY) 50-200-25 MG TABS tablet   Other Relevant Orders   HIV-1 RNA quant-no reflex-bld   T-helper cell (CD4)- ([facility] clinic only)   Healthcare maintenance    Discussed importance of safe sexual practice to reduce risk of STI.  Condoms declined. Encouraged to complete routine dental care independently.  Can place referral if necessary.         I am having Douglas Bartlett maintain his fluticasone and bictegravir-emtricitabine-tenofovir AF.   Meds ordered this encounter  Medications    bictegravir-emtricitabine-tenofovir AF (BIKTARVY) 50-200-25 MG TABS tablet    Sig: Take 1 tablet by mouth daily.    Dispense:  30 tablet    Refill:  3    Order Specific Question:   Supervising Provider    Answer:   Judyann Munson [[id number]]     Follow-up: Return in about 1 month (around 07/26/2020), or if symptoms worsen or fail to improve.   Marcos Eke, MSN, FNP-C Nurse Practitioner [facility] for Infectious Disease [facility] Main number: [phone]

## 2020-06-26 NOTE — Assessment & Plan Note (Signed)
   Discussed importance of safe sexual practice to reduce risk of STI.  Condoms declined.  Encouraged to complete routine dental care independently.  Can place referral if necessary.

## 2020-06-26 NOTE — Assessment & Plan Note (Signed)
Douglas Bartlett has improved adherence and good tolerance to his ART regimen Biktarvy.  No signs/symptoms of opportunistic infection or progressive HIV.  We reviewed lab work and discussed plan of care.  Emphasized importance of taking medications daily as prescribed.  Check blood work today.  Continue current dose of Biktarvy.  Renew financial assistance in 1 month.  Plan for follow-up in 1 month or sooner if needed with lab work on the same day.

## 2020-06-26 NOTE — Patient Instructions (Signed)
Nice to see you.  Continue take your medications daily as prescribed.  We will check your blood work today.  Refills of been sent to the pharmacy.  Renew financial assistance after July 1.  Plan for follow-up in 1 month or sooner if needed with lab work on the same day.  Have a great day and stay safe!

## 2020-06-27 LAB — T-HELPER CELL (CD4) - (RCID CLINIC ONLY)
CD4 % Helper T Cell: 23 % — ABNORMAL LOW (ref 33–65)
CD4 T Cell Abs: 442 /uL (ref 400–1790)

## 2020-06-30 LAB — HIV-1 RNA QUANT-NO REFLEX-BLD
HIV 1 RNA Quant: <20 Copies/mL — ABNORMAL HIGH
HIV-1 RNA Quant, Log: <1.3 Log cps/mL — ABNORMAL HIGH

## 2020-07-25 ENCOUNTER — Ambulatory Visit: Payer: Self-pay

## 2020-07-25 ENCOUNTER — Ambulatory Visit: Payer: Self-pay | Admitting: Family

## 2020-08-14 ENCOUNTER — Ambulatory Visit (INDEPENDENT_AMBULATORY_CARE_PROVIDER_SITE_OTHER): Payer: Self-pay | Admitting: Family

## 2020-08-14 ENCOUNTER — Other Ambulatory Visit: Payer: Self-pay

## 2020-08-14 ENCOUNTER — Ambulatory Visit: Payer: Self-pay

## 2020-08-14 ENCOUNTER — Encounter: Payer: Self-pay | Admitting: Family

## 2020-08-14 VITALS — BP 138/88 | HR 89 | Temp 97.6°F | Wt 178.0 lb

## 2020-08-14 DIAGNOSIS — Z0000 Encounter for general adult medical examination without abnormal findings: Secondary | ICD-10-CM

## 2020-08-14 DIAGNOSIS — B20 Human immunodeficiency virus [HIV] disease: Secondary | ICD-10-CM

## 2020-08-14 MED ORDER — BICTEGRAVIR-EMTRICITAB-TENOFOV 50-200-25 MG PO TABS: 1.0000 | ORAL_TABLET | Freq: Every day | ORAL | 3 refills | Status: DC

## 2020-08-14 NOTE — Patient Instructions (Addendum)
Nice to see you.  We will check your lab work today.  Continue to take your medication daily as prescribed.   Refills have been sent to the pharmacy.  We will renew your financial assistance.   Plan for follow up in 3 months or sooner if needed.   Have a great day and stay safe!

## 2020-08-14 NOTE — Progress Notes (Signed)
Brief Narrative   Patient ID: Douglas Bartlett, male    DOB: March 20, 1990, 30 y.o.   MRN: [id number]    Subjective:    Chief Complaint  Patient presents with   Follow-up    B20     HPI:  Douglas Bartlett is a 30 y.o. male with HIV disease last seen on 06/26/2020 with well-controlled virus and good adherence and tolerance to his ART regimen of Biktarvy.  Viral load was undetectable with CD4 count 442.  Here today for routine follow-up and financial assistance renewal.  Douglas Bartlett continues to take his Biktarvy daily as prescribed with 1 missed dose since his last office visit.  Positive for COVID last week with symptoms lasting approximately 3 days and feeling improved today with only occasional cough. Denies fevers, chills, night sweats, headaches, changes in vision, neck pain/stiffness, nausea, diarrhea, vomiting, lesions or rashes.  Douglas Bartlett has no problems obtaining medication from the pharmacy and remains covered through [employer].  He will renew financial assistance today.  Denies feelings of being down, depressed, or hopeless recently.  Drinks alcohol on occasion with no recreational or illicit drug use and a former tobacco smoker.  Condoms offered. Routine vaccinations are up to date.   No Known Allergies    Outpatient Medications Prior to Visit  Medication Sig Dispense Refill   bictegravir-emtricitabine-tenofovir AF (BIKTARVY) 50-200-25 MG TABS tablet Take 1 tablet by mouth daily. 30 tablet 3   fluticasone (FLONASE) 50 MCG/ACT nasal spray Place 2 sprays into both nostrils daily. (Patient not taking: Reported on 08/14/2020) 9.9 g 2   No facility-administered medications prior to visit.     Past Medical History:  Diagnosis Date   Chicken pox      Past Surgical History:  Procedure Laterality Date   TONSILLECTOMY        Review of Systems  Constitutional:  Negative for appetite change, chills, fatigue, fever and unexpected weight change.  Eyes:  Negative for visual  disturbance.  Respiratory:  Negative for cough, chest tightness, shortness of breath and wheezing.   Cardiovascular:  Negative for chest pain and leg swelling.  Gastrointestinal:  Negative for abdominal pain, constipation, diarrhea, nausea and vomiting.  Genitourinary:  Negative for dysuria, flank pain, frequency, genital sores, hematuria and urgency.  Skin:  Negative for rash.  Allergic/Immunologic: Negative for immunocompromised state.  Neurological:  Negative for dizziness and headaches.     Objective:    BP 138/88   Pulse 89   Temp 97.6 F (36.4 C) (Oral)   Wt 178 lb (80.7 kg)   SpO2 98%   BMI 22.85 kg/m  Nursing note and vital signs reviewed.  Physical Exam Constitutional:      General: He is not in acute distress.    Appearance: He is well-developed.  Eyes:     Conjunctiva/sclera: Conjunctivae normal.  Cardiovascular:     Rate and Rhythm: Normal rate and regular rhythm.     Heart sounds: Normal heart sounds. No murmur heard.   No friction rub. No gallop.  Pulmonary:     Effort: Pulmonary effort is normal. No respiratory distress.     Breath sounds: Normal breath sounds. No wheezing or rales.  Chest:     Chest wall: No tenderness.  Abdominal:     General: Bowel sounds are normal.     Palpations: Abdomen is soft.     Tenderness: There is no abdominal tenderness.  Musculoskeletal:     Cervical back: Neck supple.  Lymphadenopathy:  Cervical: No cervical adenopathy.  Skin:    General: Skin is warm and dry.     Findings: No rash.  Neurological:     Mental Status: He is alert and oriented to person, place, and time.  Psychiatric:        Behavior: Behavior normal.        Thought Content: Thought content normal.        Judgment: Judgment normal.     Depression screen [facility] 2/9 06/26/2020 01/06/2017 07/06/2016 04/06/2016 02/09/2016  Decreased Interest 0 0 0 0 0  Down, Depressed, Hopeless 0 0 0 0 0  PHQ - 2 Score 0 0 0 0 0       Assessment & Plan:    Patient Active  Problem List   Diagnosis Date Noted   Healthcare maintenance 06/26/2020   Screening examination for venereal disease 01/06/2017   Encounter for long-term (current) use of high-risk medication 01/06/2017   Need for prophylactic vaccination against Streptococcus pneumoniae (pneumococcus) 07/06/2016   Medication monitoring encounter 04/06/2016   Human immunodeficiency virus I infection (HCC) 01/15/2016   Routine general medical examination at a health care facility 07/30/2015   Rash and nonspecific skin eruption 05/26/2015   Syncopal episodes 11/12/2014     Problem List Items Addressed This Visit       Other   Human immunodeficiency virus I infection (HCC) - Primary    [name] continues to have well-controlled HIV disease with good adherence and tolerance to his ART regimen of Biktarvy.  No signs/symptoms of opportunistic infection.  We reviewed previous lab work and discussed plan of care.  Renew financial assistance.  Check blood work today.  Continue current dose of Biktarvy.  Plan for follow-up in 3 months or sooner if needed with lab work on the same day.      Relevant Medications   bictegravir-emtricitabine-tenofovir AF (BIKTARVY) 50-200-25 MG TABS tablet   Other Relevant Orders   HIV 1 RNA quant-no reflex-bld   T-helper cell (CD4)- ([facility] only)   Healthcare maintenance    Discussed importance of safe sexual practice to reduce risk of STI.  Condoms offered. Routine vaccinations up-to-date per recommendations.        I have discontinued Douglas Bartlett's fluticasone. I am also having him maintain his bictegravir-emtricitabine-tenofovir AF.   Meds ordered this encounter  Medications   bictegravir-emtricitabine-tenofovir AF (BIKTARVY) 50-200-25 MG TABS tablet    Sig: Take 1 tablet by mouth daily.    Dispense:  30 tablet    Refill:  3    Order Specific Question:   Supervising Provider    Answer:   Douglas Bartlett [[id number]]     Follow-up: Return in about 3 months  (around 11/14/2020), or if symptoms worsen or fail to improve.   Douglas Eke, MSN, FNP-C Nurse Practitioner [facility] for Infectious Disease [facility] Main number: [phone]

## 2020-08-14 NOTE — Assessment & Plan Note (Signed)
Douglas Bartlett continues to have well-controlled HIV disease with good adherence and tolerance to his ART regimen of Biktarvy.  No signs/symptoms of opportunistic infection.  We reviewed previous lab work and discussed plan of care.  Renew financial assistance.  Check blood work today.  Continue current dose of Biktarvy.  Plan for follow-up in 3 months or sooner if needed with lab work on the same day.

## 2020-08-14 NOTE — Assessment & Plan Note (Signed)
   Discussed importance of safe sexual practice to reduce risk of STI.  Condoms offered.  Routine vaccinations up-to-date per recommendations. 

## 2020-08-15 LAB — T-HELPER CELL (CD4) - (RCID CLINIC ONLY)
CD4 % Helper T Cell: 26 % — ABNORMAL LOW (ref 33–65)
CD4 T Cell Abs: 373 /uL — ABNORMAL LOW (ref 400–1790)

## 2020-08-18 LAB — HIV-1 RNA QUANT-NO REFLEX-BLD
HIV 1 RNA Quant: NOT DETECTED Copies/mL
HIV-1 RNA Quant, Log: NOT DETECTED Log cps/mL

## 2020-09-09 ENCOUNTER — Encounter: Payer: Self-pay | Admitting: Family

## 2020-11-14 ENCOUNTER — Ambulatory Visit: Payer: Self-pay | Admitting: Family

## 2020-12-04 ENCOUNTER — Ambulatory Visit (INDEPENDENT_AMBULATORY_CARE_PROVIDER_SITE_OTHER): Payer: Self-pay | Admitting: Family

## 2020-12-04 ENCOUNTER — Encounter: Payer: Self-pay | Admitting: Family

## 2020-12-04 ENCOUNTER — Other Ambulatory Visit: Payer: Self-pay

## 2020-12-04 VITALS — BP 143/88 | HR 71 | Temp 97.4°F | Wt 186.0 lb

## 2020-12-04 DIAGNOSIS — B20 Human immunodeficiency virus [HIV] disease: Secondary | ICD-10-CM

## 2020-12-04 DIAGNOSIS — Z0000 Encounter for general adult medical examination without abnormal findings: Secondary | ICD-10-CM

## 2020-12-04 MED ORDER — BICTEGRAVIR-EMTRICITAB-TENOFOV 50-200-25 MG PO TABS: 1.0000 | ORAL_TABLET | Freq: Every day | ORAL | 5 refills | Status: DC

## 2020-12-04 NOTE — Patient Instructions (Signed)
Nice to see you.  We will check your lab work today.  Continue to take your medication daily as prescribed.  Refills have been sent to the pharmacy.  Plan for follow up in 3 months or sooner if needed with lab work on the same day.  Have a great day and stay safe!  

## 2020-12-04 NOTE — Assessment & Plan Note (Signed)
Douglas Bartlett continues to have well-controlled virus with good adherence and tolerance to his ART regimen of Biktarvy.  No signs/symptoms of opportunistic infection.  We reviewed previous lab work and discussed plan of care.  Continue current dose of Biktarvy.  Check blood work today.  Need to renew financial assistance after January 1.  Plan for follow-up in 3 months or sooner if needed with lab work on the same day.

## 2020-12-04 NOTE — Assessment & Plan Note (Signed)
   Discussed importance of safe sexual practices and condom use.  Condoms offered.  Influenza vaccine declined.

## 2020-12-04 NOTE — Progress Notes (Signed)
Brief Narrative   Patient ID: Douglas Bartlett, male    DOB: 1990-04-15, 30 y.o.   MRN: [id number]  Mr. Stalls is a 30 y/o AA male diagnosed with HIV disease in January 2018 with risk factor of MSM. Initial viral load of 41,259 with CD4 count of 340. Genotype with no significant resistance. Entered care at [facility] Stage 2. No opportunistic infection. [id number] negative. ART regimen of Triumeq and Biktarvy.   Subjective:    Chief Complaint  Patient presents with   Follow-up    B20    HPI:  Douglas Bartlett is a 30 y.o. male with HIV disease last seen on 08/14/2020 with well-controlled virus and good adherence and tolerance to his ART regimen of Biktarvy.  Viral load was undetectable with CD4 count of 373.  Here today for routine follow-up.  Mr. Talbert continues to take his Biktarvy daily as prescribed with no adverse side effects.  Overall feeling well today with no new concerns/complaints. Denies fevers, chills, night sweats, headaches, changes in vision, neck pain/stiffness, nausea, diarrhea, vomiting, lesions or rashes.  Mr. Steffensmeier has no problems obtaining medication from pharmacy and remains covered by [employer].  Denies feelings of being down, depressed, or hopeless recently.  No current recreational or illicit drug use or tobacco use with occasional alcohol consumption.  Condoms offered.  Healthcare maintenance due includes influenza vaccine.  Recently took a trip to [place]    No Known Allergies    Outpatient Medications Prior to Visit  Medication Sig Dispense Refill   bictegravir-emtricitabine-tenofovir AF (BIKTARVY) 50-200-25 MG TABS tablet Take 1 tablet by mouth daily. 30 tablet 3   No facility-administered medications prior to visit.     Past Medical History:  Diagnosis Date   Chicken pox      Past Surgical History:  Procedure Laterality Date   TONSILLECTOMY        Review of Systems  Constitutional:  Negative for appetite change, chills, diaphoresis, fatigue, fever  and unexpected weight change.  Eyes:  Negative for visual disturbance.  Respiratory:  Negative for cough, chest tightness, shortness of breath and wheezing.   Cardiovascular:  Negative for chest pain and leg swelling.  Gastrointestinal:  Negative for abdominal pain, constipation, diarrhea, nausea and vomiting.  Genitourinary:  Negative for dysuria, flank pain, frequency, genital sores, hematuria and urgency.  Skin:  Negative for rash.  Allergic/Immunologic: Negative for immunocompromised state.  Neurological:  Negative for dizziness and headaches.     Objective:    BP (!) 143/88 (BP Location: Left Arm)   Pulse 71   Temp (!) 97.4 F (36.3 C) (Temporal)   Wt 186 lb (84.4 kg)   BMI 23.88 kg/m  Nursing note and vital signs reviewed.  Physical Exam Constitutional:      General: He is not in acute distress.    Appearance: He is well-developed.  Eyes:     Conjunctiva/sclera: Conjunctivae normal.  Cardiovascular:     Rate and Rhythm: Normal rate and regular rhythm.     Heart sounds: Normal heart sounds. No murmur heard.   No friction rub. No gallop.  Pulmonary:     Effort: Pulmonary effort is normal. No respiratory distress.     Breath sounds: Normal breath sounds. No wheezing or rales.  Chest:     Chest wall: No tenderness.  Abdominal:     General: Bowel sounds are normal.     Palpations: Abdomen is soft.     Tenderness: There is no abdominal tenderness.  Musculoskeletal:  Cervical back: Neck supple.  Lymphadenopathy:     Cervical: No cervical adenopathy.  Skin:    General: Skin is warm and dry.     Findings: No rash.  Neurological:     Mental Status: He is alert and oriented to person, place, and time.  Psychiatric:        Behavior: Behavior normal.        Thought Content: Thought content normal.        Judgment: Judgment normal.     Depression screen [facility] 2/9 12/04/2020 06/26/2020 01/06/2017 07/06/2016 04/06/2016  Decreased Interest 0 0 0 0 0  Down, Depressed, Hopeless  0 0 0 0 0  PHQ - 2 Score 0 0 0 0 0       Assessment & Plan:    Patient Active Problem List   Diagnosis Date Noted   Healthcare maintenance 06/26/2020   Screening examination for venereal disease 01/06/2017   Encounter for long-term (current) use of high-risk medication 01/06/2017   Need for prophylactic vaccination against Streptococcus pneumoniae (pneumococcus) 07/06/2016   Medication monitoring encounter 04/06/2016   Human immunodeficiency virus I infection (HCC) 01/15/2016   Routine general medical examination at a health care facility 07/30/2015   Rash and nonspecific skin eruption 05/26/2015   Syncopal episodes 11/12/2014     Problem List Items Addressed This Visit       Other   Human immunodeficiency virus I infection (HCC) - Primary    Mr. Joos continues to have well-controlled virus with good adherence and tolerance to his ART regimen of Biktarvy.  No signs/symptoms of opportunistic infection.  We reviewed previous lab work and discussed plan of care.  Continue current dose of Biktarvy.  Check blood work today.  Need to renew financial assistance after January 1.  Plan for follow-up in 3 months or sooner if needed with lab work on the same day.      Relevant Medications   bictegravir-emtricitabine-tenofovir AF (BIKTARVY) 50-200-25 MG TABS tablet   Other Relevant Orders   COMPLETE METABOLIC PANEL WITH GFR   T-helper cell (CD4)- ([facility] clinic only)   HIV-1 RNA quant-no reflex-bld   Healthcare maintenance    Discussed importance of safe sexual practices and condom use.  Condoms offered. Influenza vaccine declined.         I am having [name] maintain his bictegravir-emtricitabine-tenofovir AF.   Meds ordered this encounter  Medications   bictegravir-emtricitabine-tenofovir AF (BIKTARVY) 50-200-25 MG TABS tablet    Sig: Take 1 tablet by mouth daily.    Dispense:  30 tablet    Refill:  5    Order Specific Question:   Supervising Provider    Answer:    Judyann Munson [[id number]]     Follow-up: Return in about 3 months (around 03/04/2021), or if symptoms worsen or fail to improve.   Marcos Eke, MSN, FNP-C Nurse Practitioner [facility] Main number: [phone]

## 2020-12-05 LAB — T-HELPER CELL (CD4) - (RCID CLINIC ONLY)
CD4 % Helper T Cell: 26 % — ABNORMAL LOW (ref 33–65)
CD4 T Cell Abs: 416 /uL (ref 400–1790)

## 2020-12-07 LAB — COMPLETE METABOLIC PANEL WITH GFR
AG Ratio: 1.3 (calc) (ref 1.0–2.5)
ALT: 19 U/L (ref 9–46)
AST: 20 U/L (ref 10–40)
Albumin: 4.4 g/dL (ref 3.6–5.1)
Alkaline phosphatase (APISO): 54 U/L (ref 36–130)
BUN: 16 mg/dL (ref 7–25)
CO2: 28 mmol/L (ref 20–32)
Calcium: 9.6 mg/dL (ref 8.6–10.3)
Chloride: 107 mmol/L (ref 98–110)
Creat: 1.11 mg/dL (ref 0.60–1.26)
Globulin: 3.5 g/dL (calc) (ref 1.9–3.7)
Glucose, Bld: 93 mg/dL (ref 65–99)
Potassium: 4.4 mmol/L (ref 3.5–5.3)
Sodium: 142 mmol/L (ref 135–146)
Total Bilirubin: 0.7 mg/dL (ref 0.2–1.2)
Total Protein: 7.9 g/dL (ref 6.1–8.1)
eGFR: 92 mL/min/{1.73_m2} (ref >=60)

## 2020-12-07 LAB — HIV-1 RNA QUANT-NO REFLEX-BLD
HIV 1 RNA Quant: NOT DETECTED Copies/mL
HIV-1 RNA Quant, Log: NOT DETECTED Log cps/mL

## 2021-03-05 ENCOUNTER — Other Ambulatory Visit (HOSPITAL_COMMUNITY): Payer: Self-pay

## 2021-03-05 ENCOUNTER — Ambulatory Visit: Payer: Self-pay | Admitting: Family

## 2021-03-24 ENCOUNTER — Encounter: Payer: Self-pay | Admitting: Family

## 2021-03-24 ENCOUNTER — Other Ambulatory Visit: Payer: Self-pay

## 2021-03-24 ENCOUNTER — Ambulatory Visit (INDEPENDENT_AMBULATORY_CARE_PROVIDER_SITE_OTHER): Payer: Self-pay | Admitting: Family

## 2021-03-24 VITALS — BP 133/91 | HR 62 | Temp 97.7°F | Ht 73.0 in | Wt 184.0 lb

## 2021-03-24 DIAGNOSIS — Z113 Encounter for screening for infections with a predominantly sexual mode of transmission: Secondary | ICD-10-CM

## 2021-03-24 DIAGNOSIS — Z21 Asymptomatic human immunodeficiency virus [HIV] infection status: Secondary | ICD-10-CM

## 2021-03-24 DIAGNOSIS — Z79899 Other long term (current) drug therapy: Secondary | ICD-10-CM

## 2021-03-24 DIAGNOSIS — Z0000 Encounter for general adult medical examination without abnormal findings: Secondary | ICD-10-CM

## 2021-03-24 DIAGNOSIS — B20 Human immunodeficiency virus [HIV] disease: Secondary | ICD-10-CM

## 2021-03-24 MED ORDER — BICTEGRAVIR-EMTRICITAB-TENOFOV 50-200-25 MG PO TABS: 1.0000 | ORAL_TABLET | Freq: Every day | ORAL | 5 refills | Status: DC

## 2021-03-24 NOTE — Assessment & Plan Note (Signed)
?   Discussed importance of safe sexual practices and condom use.  Condoms offered. ?? Will be due for Menveo and Pneumovax at next office visit. ?

## 2021-03-24 NOTE — Patient Instructions (Signed)
Nice to see you.  We will check your lab work today.  Continue to take your medication daily as prescribed.  Refills have been sent to the pharmacy.  Plan for follow up in 3 months or sooner if needed with lab work on the same day.  Have a great day and stay safe!  

## 2021-03-24 NOTE — Assessment & Plan Note (Signed)
Mr. Gregorio continues to have well-controlled virus with good adherence and tolerance to his ART regimen of Biktarvy.  No signs/symptoms of opportunistic infection.  We reviewed previous lab work and discussed plan of care.  Check blood work today.  Renew financial assistance.  Continue current dose of Biktarvy.  Plan for follow-up in 3 months or sooner if needed with lab work on the same day. ?

## 2021-03-24 NOTE — Progress Notes (Signed)
? ? ?Brief Narrative  ? ?Patient ID: Douglas Bartlett, male    DOB: [birth year], 31 y.o.   MRN: [id number] ? ?[name] is a 31 y/o AA male diagnosed with HIV disease in January 2018 with risk factor of MSM. Initial viral load of 41,259 with CD4 count of 340. Genotype with no significant resistance. Entered care at [facility] Stage 2. No opportunistic infection. [id number] negative. ART regimen of Triumeq and Biktarvy.  ? ?Subjective:  ?  ?Chief Complaint  ?Patient presents with  ? Follow-up  ?  Declined condoms, declined STI testing   ? ? ?HPI: ? ?Douglas Bartlett is a 31 y.o. male with HIV disease last seen on 12/04/2020 with well-controlled virus and good adherence and tolerance to his ART regimen of Biktarvy.  Viral load was undetectable with CD4 count of 416.  Kidney function, liver function, electrolytes within normal ranges.  Here today for routine follow-up. ? ?[name] continues to take his Biktarvy daily as prescribed with no adverse side effects.  Overall feeling well today with no new concerns/complaints. Denies fevers, chills, night sweats, headaches, changes in vision, neck pain/stiffness, nausea, diarrhea, vomiting, lesions or rashes. ? ?[name] has no problems obtaining medication from the pharmacy.  Denies feelings of being down, depressed, or hopeless recently.  Continues to smoke marijuana daily with alcohol on the weekends and no tobacco use.  Condoms offered.  Declines STI testing.  Will be due for Pneumovax at next office visit. ? ? ?No Known Allergies ? ? ? ?Outpatient Medications Prior to Visit  ?Medication Sig Dispense Refill  ? bictegravir-emtricitabine-tenofovir AF (BIKTARVY) 50-200-25 MG TABS tablet Take 1 tablet by mouth daily. 30 tablet 5  ? ?No facility-administered medications prior to visit.  ? ? ? ?Past Medical History:  ?Diagnosis Date  ? Chicken pox   ? ? ? ?Past Surgical History:  ?Procedure Laterality Date  ? TONSILLECTOMY    ? ? ? ? ?Review of Systems  ?Constitutional:  Negative for appetite  change, chills, fatigue, fever and unexpected weight change.  ?Eyes:  Negative for visual disturbance.  ?Respiratory:  Negative for cough, chest tightness, shortness of breath and wheezing.   ?Cardiovascular:  Negative for chest pain and leg swelling.  ?Gastrointestinal:  Negative for abdominal pain, constipation, diarrhea, nausea and vomiting.  ?Genitourinary:  Negative for dysuria, flank pain, frequency, genital sores, hematuria and urgency.  ?Skin:  Negative for rash.  ?Allergic/Immunologic: Negative for immunocompromised state.  ?Neurological:  Negative for dizziness and headaches.  ?   ?Objective:  ?  ?BP (!) 133/91   Pulse 62   Temp 97.7 ?F (36.5 ?C) (Oral)   Ht 6\' 1"  (1.854 m)   Wt 184 lb (83.5 kg)   SpO2 100%   BMI 24.28 kg/m?  ?Nursing note and vital signs reviewed. ? ?Physical Exam ?Constitutional:   ?   General: He is not in acute distress. ?   Appearance: He is well-developed.  ?Eyes:  ?   Conjunctiva/sclera: Conjunctivae normal.  ?Cardiovascular:  ?   Rate and Rhythm: Normal rate and regular rhythm.  ?   Heart sounds: Normal heart sounds. No murmur heard. ?  No friction rub. No gallop.  ?Pulmonary:  ?   Effort: Pulmonary effort is normal. No respiratory distress.  ?   Breath sounds: Normal breath sounds. No wheezing or rales.  ?Chest:  ?   Chest wall: No tenderness.  ?Abdominal:  ?   General: Bowel sounds are normal.  ?   Palpations: Abdomen is  soft.  ?   Tenderness: There is no abdominal tenderness.  ?Musculoskeletal:  ?   Cervical back: Neck supple.  ?Lymphadenopathy:  ?   Cervical: No cervical adenopathy.  ?Skin: ?   General: Skin is warm and dry.  ?   Findings: No rash.  ?Neurological:  ?   Mental Status: He is alert and oriented to person, place, and time.  ?Psychiatric:     ?   Behavior: Behavior normal.     ?   Thought Content: Thought content normal.     ?   Judgment: Judgment normal.  ? ? ? ?Depression screen [facility] 2/9 03/24/2021 12/04/2020 06/26/2020 01/06/2017 07/06/2016  ?Decreased Interest 0 0  0 0 0  ?Down, Depressed, Hopeless 0 0 0 0 0  ?PHQ - 2 Score 0 0 0 0 0  ?  ?   ?Assessment & Plan:  ? ? ?Patient Active Problem List  ? Diagnosis Date Noted  ? Healthcare maintenance 06/26/2020  ? Screening examination for venereal disease 01/06/2017  ? Encounter for long-term (current) use of high-risk medication 01/06/2017  ? Need for prophylactic vaccination against Streptococcus pneumoniae (pneumococcus) 07/06/2016  ? Medication monitoring encounter 04/06/2016  ? Human immunodeficiency virus I infection ([place]) 01/15/2016  ? Routine general medical examination at a health care facility 07/30/2015  ? Rash and nonspecific skin eruption 05/26/2015  ? Syncopal episodes 11/12/2014  ? ? ? ?Problem List Items Addressed This Visit   ? ?  ? Other  ? Human immunodeficiency virus I infection ([place])  ?  [name] continues to have well-controlled virus with good adherence and tolerance to his ART regimen of Biktarvy.  No signs/symptoms of opportunistic infection.  We reviewed previous lab work and discussed plan of care.  Check blood work today.  Renew financial assistance.  Continue current dose of Biktarvy.  Plan for follow-up in 3 months or sooner if needed with lab work on the same day. ?  ?  ? Relevant Medications  ? bictegravir-emtricitabine-tenofovir AF (BIKTARVY) 50-200-25 MG TABS tablet  ? Other Relevant Orders  ? HIV-1 RNA quant-no reflex-bld  ? T-helper cell (CD4)- ([facility] clinic only)  ? Healthcare maintenance - Primary  ?  Discussed importance of safe sexual practices and condom use.  Condoms offered. ?Will be due for Menveo and Pneumovax at next office visit. ?  ?  ? ?Other Visit Diagnoses   ? ? Pharmacologic therapy      ? Screening for STDs (sexually transmitted diseases)      ? Relevant Orders  ? RPR  ? ?  ? ? ? ?I am having [name] maintain his bictegravir-emtricitabine-tenofovir AF. ? ? ?Meds ordered this encounter  ?Medications  ? bictegravir-emtricitabine-tenofovir AF (BIKTARVY) 50-200-25 MG TABS tablet   ?  Sig: Take 1 tablet by mouth daily.  ?  Dispense:  30 tablet  ?  Refill:  5  ?  Order Specific Question:   Supervising Provider  ?  Answer:   Carlyle Basques [[id number]]  ? ? ? ?Follow-up: Return in about 3 months (around 06/24/2021), or if symptoms worsen or fail to improve. ? ? ?Terri Piedra, MSN, FNP-C ?Nurse Practitioner ?[place] [facility] ?[facility] ?[facility] Main number: [phone] ? ? ?

## 2021-03-25 LAB — T-HELPER CELL (CD4) - (RCID CLINIC ONLY)
CD4 % Helper T Cell: 30 % — ABNORMAL LOW (ref 33–65)
CD4 T Cell Abs: 429 /uL (ref 400–1790)

## 2021-03-26 LAB — RPR: RPR Ser Ql: NONREACTIVE

## 2021-03-26 LAB — HIV-1 RNA QUANT-NO REFLEX-BLD
HIV 1 RNA Quant: 187 Copies/mL — ABNORMAL HIGH
HIV-1 RNA Quant, Log: 2.27 Log cps/mL — ABNORMAL HIGH

## 2021-05-16 ENCOUNTER — Other Ambulatory Visit: Payer: Self-pay | Admitting: Family

## 2021-05-16 DIAGNOSIS — B20 Human immunodeficiency virus [HIV] disease: Secondary | ICD-10-CM

## 2021-06-25 ENCOUNTER — Other Ambulatory Visit: Payer: Self-pay

## 2021-06-25 ENCOUNTER — Ambulatory Visit (INDEPENDENT_AMBULATORY_CARE_PROVIDER_SITE_OTHER): Payer: Self-pay | Admitting: Family

## 2021-06-25 ENCOUNTER — Encounter: Payer: Self-pay | Admitting: Family

## 2021-06-25 VITALS — BP 146/94 | HR 71 | Temp 97.6°F | Wt 181.0 lb

## 2021-06-25 DIAGNOSIS — Z5181 Encounter for therapeutic drug level monitoring: Secondary | ICD-10-CM | POA: Insufficient documentation

## 2021-06-25 DIAGNOSIS — Z113 Encounter for screening for infections with a predominantly sexual mode of transmission: Secondary | ICD-10-CM

## 2021-06-25 DIAGNOSIS — Z0000 Encounter for general adult medical examination without abnormal findings: Secondary | ICD-10-CM

## 2021-06-25 DIAGNOSIS — B20 Human immunodeficiency virus [HIV] disease: Secondary | ICD-10-CM

## 2021-06-25 MED ORDER — BICTEGRAVIR-EMTRICITAB-TENOFOV 50-200-25 MG PO TABS: 1.0000 | ORAL_TABLET | Freq: Every day | ORAL | 5 refills | Status: DC

## 2021-06-25 NOTE — Assessment & Plan Note (Signed)
Mr. Colegrove continues to have adequately controlled virus with good adherence and tolerance to [employer].  Reviewed previous lab work and discussed plan of care.  Emphasized importance of taking medications daily.  Check lab work today.  Continue current dose of Biktarvy.  Renew financial assistance after July 1.  Plan for follow-up in 4 months or sooner if needed with lab work on the same day.

## 2021-06-25 NOTE — Progress Notes (Signed)
Brief Narrative   Patient ID: Douglas Bartlett, male    DOB: 04-21-1990, 31 y.o.   MRN: [id number]  Douglas Bartlett is a 31 y/o AA male diagnosed with HIV disease in January 2018 with risk factor of MSM. Initial viral load of 41,259 with CD4 count of 340. Genotype with no significant resistance. Entered care at [facility] Stage 2. No opportunistic infection. [id number] negative. ART regimen of Triumeq and Biktarvy.   Subjective:    Chief Complaint  Patient presents with   Follow-up    Missed one week of Biktarvy due to pharmacy    HPI:  Douglas Bartlett is a 31 y.o. male with HIV disease last seen on 03/24/2021 with well-controlled virus and good adherence and tolerance to [employer].  Viral load at the time was 187 with CD4 count of 429.  Kidney function, liver function, electrolytes within normal ranges.  Here today for follow-up.  Douglas Bartlett continues to take his Biktarvy as prescribed with approximately 1 week of missed medication secondary to pharmacy issues which have since been resolved.  Overall feeling well today with no new concerns/complaints. Denies fevers, chills, night sweats, headaches, changes in vision, neck pain/stiffness, nausea, diarrhea, vomiting, lesions or rashes.  Douglas Bartlett denies feelings of being down, depressed, or hopeless recently.  Continues to drink alcohol on occasion and use marijuana daily with every day e-cigarette use.  Condoms offered.  Due for routine dental care.   No Known Allergies    Outpatient Medications Prior to Visit  Medication Sig Dispense Refill   bictegravir-emtricitabine-tenofovir AF (BIKTARVY) 50-200-25 MG TABS tablet Take 1 tablet by mouth daily. 30 tablet 5   No facility-administered medications prior to visit.     Past Medical History:  Diagnosis Date   Chicken pox      Past Surgical History:  Procedure Laterality Date   TONSILLECTOMY        Review of Systems  Constitutional:  Negative for appetite change, chills, fatigue, fever and  unexpected weight change.  Eyes:  Negative for visual disturbance.  Respiratory:  Negative for cough, chest tightness, shortness of breath and wheezing.   Cardiovascular:  Negative for chest pain and leg swelling.  Gastrointestinal:  Negative for abdominal pain, constipation, diarrhea, nausea and vomiting.  Genitourinary:  Negative for dysuria, flank pain, frequency, genital sores, hematuria and urgency.  Skin:  Negative for rash.  Allergic/Immunologic: Negative for immunocompromised state.  Neurological:  Negative for dizziness and headaches.      Objective:    BP (!) 146/94   Pulse 71   Temp 97.6 F (36.4 C) (Oral)   Wt 181 lb (82.1 kg)   SpO2 99%   BMI 23.88 kg/m  Nursing note and vital signs reviewed.  Physical Exam Constitutional:      General: He is not in acute distress.    Appearance: He is well-developed.  Eyes:     Conjunctiva/sclera: Conjunctivae normal.  Cardiovascular:     Rate and Rhythm: Normal rate and regular rhythm.     Heart sounds: Normal heart sounds. No murmur heard.    No friction rub. No gallop.  Pulmonary:     Effort: Pulmonary effort is normal. No respiratory distress.     Breath sounds: Normal breath sounds. No wheezing or rales.  Chest:     Chest wall: No tenderness.  Abdominal:     General: Bowel sounds are normal.     Palpations: Abdomen is soft.     Tenderness: There is no abdominal tenderness.  Musculoskeletal:     Cervical back: Neck supple.  Lymphadenopathy:     Cervical: No cervical adenopathy.  Skin:    General: Skin is warm and dry.     Findings: No rash.  Neurological:     Mental Status: He is alert and oriented to person, place, and time.  Psychiatric:        Behavior: Behavior normal.        Thought Content: Thought content normal.        Judgment: Judgment normal.         06/25/2021    8:55 AM 03/24/2021    9:17 AM 12/04/2020   10:23 AM 06/26/2020    8:50 AM 01/06/2017   11:06 AM  Depression screen PHQ 2/9   Decreased Interest 0 0 0 0 0  Down, Depressed, Hopeless 0 0 0 0 0  PHQ - 2 Score 0 0 0 0 0       Assessment & Plan:    Patient Active Problem List   Diagnosis Date Noted   Therapeutic drug monitoring 06/25/2021   Healthcare maintenance 06/26/2020   Screening examination for venereal disease 01/06/2017   Encounter for long-term (current) use of high-risk medication 01/06/2017   Need for prophylactic vaccination against Streptococcus pneumoniae (pneumococcus) 07/06/2016   Medication monitoring encounter 04/06/2016   Human immunodeficiency virus I infection (HCC) 01/15/2016   Routine general medical examination at a health care facility 07/30/2015   Rash and nonspecific skin eruption 05/26/2015   Syncopal episodes 11/12/2014     Problem List Items Addressed This Visit       Other   Human immunodeficiency virus I infection (HCC) - Primary    Douglas Bartlett continues to have adequately controlled virus with good adherence and tolerance to [employer].  Reviewed previous lab work and discussed plan of care.  Emphasized importance of taking medications daily.  Check lab work today.  Continue current dose of Biktarvy.  Renew financial assistance after July 1.  Plan for follow-up in 4 months or sooner if needed with lab work on the same day.      Relevant Medications   bictegravir-emtricitabine-tenofovir AF (BIKTARVY) 50-200-25 MG TABS tablet   Other Relevant Orders   Comprehensive metabolic panel   HIV-1 RNA quant-no reflex-bld   T-helper cell (CD4)- ([facility] clinic only)   Healthcare maintenance    Discussed importance of safe sexual practices and condom use.  Condoms offered. Routine vaccinations up-to-date. Due for routine dental care which she will complete independently and can refer to [facility] if needed.       Therapeutic drug monitoring    Renal and hepatic function stable with current dose of Biktarvy.  Continue to monitor.      Other Visit Diagnoses     Screening for STDs  (sexually transmitted diseases)            I am having Douglas Bartlett maintain his bictegravir-emtricitabine-tenofovir AF.   Meds ordered this encounter  Medications   bictegravir-emtricitabine-tenofovir AF (BIKTARVY) 50-200-25 MG TABS tablet    Sig: Take 1 tablet by mouth daily.    Dispense:  30 tablet    Refill:  5    Order Specific Question:   Supervising Provider    Answer:   Judyann Munson [[id number]]     Follow-up: Return in about 4 months (around 10/25/2021), or if symptoms worsen or fail to improve.   Marcos Eke, MSN, FNP-C Nurse Practitioner [facility] Main  number: [phone]

## 2021-06-25 NOTE — Assessment & Plan Note (Signed)
   Discussed importance of safe sexual practices and condom use.  Condoms offered.  Routine vaccinations up-to-date.  Due for routine dental care which she will complete independently and can refer to [facility] if needed.

## 2021-06-25 NOTE — Patient Instructions (Addendum)
Nice to see you.  We will check your lab work today.  Continue to take your medication daily as prescribed.  Refills have been sent to the pharmacy.  Renew financial assistance between July 1st to Sept 1st.   Plan for follow up in 4 months or sooner if needed with lab work on the same day.  Have a great day and stay safe!

## 2021-06-25 NOTE — Assessment & Plan Note (Signed)
Renal and hepatic function stable with current dose of Biktarvy.  Continue to monitor.

## 2021-06-26 LAB — T-HELPER CELL (CD4) - (RCID CLINIC ONLY)
CD4 % Helper T Cell: 30 % — ABNORMAL LOW (ref 33–65)
CD4 T Cell Abs: 427 /uL (ref 400–1790)

## 2021-06-30 LAB — HIV-1 RNA QUANT-NO REFLEX-BLD
HIV 1 RNA Quant: NOT DETECTED Copies/mL
HIV-1 RNA Quant, Log: NOT DETECTED Log cps/mL

## 2021-06-30 LAB — COMPREHENSIVE METABOLIC PANEL
AG Ratio: 1.4 (calc) (ref 1.0–2.5)
ALT: 20 U/L (ref 9–46)
AST: 23 U/L (ref 10–40)
Albumin: 4.6 g/dL (ref 3.6–5.1)
Alkaline phosphatase (APISO): 53 U/L (ref 36–130)
BUN: 11 mg/dL (ref 7–25)
CO2: 28 mmol/L (ref 20–32)
Calcium: 9.2 mg/dL (ref 8.6–10.3)
Chloride: 107 mmol/L (ref 98–110)
Creat: 1.02 mg/dL (ref 0.60–1.26)
Globulin: 3.2 g/dL (calc) (ref 1.9–3.7)
Glucose, Bld: 99 mg/dL (ref 65–99)
Potassium: 4.1 mmol/L (ref 3.5–5.3)
Sodium: 140 mmol/L (ref 135–146)
Total Bilirubin: 0.6 mg/dL (ref 0.2–1.2)
Total Protein: 7.8 g/dL (ref 6.1–8.1)

## 2021-07-14 ENCOUNTER — Ambulatory Visit: Payer: Self-pay

## 2021-07-17 ENCOUNTER — Emergency Department (HOSPITAL_COMMUNITY): Payer: Managed Care, Other (non HMO)

## 2021-07-17 ENCOUNTER — Emergency Department (HOSPITAL_COMMUNITY)
Admission: EM | Admit: 2021-07-17 | Discharge: 2021-07-17 | Disposition: A | Discharge status: Home / Self Care | Payer: Managed Care, Other (non HMO) | Attending: Emergency Medicine | Admitting: Emergency Medicine

## 2021-07-17 ENCOUNTER — Encounter (HOSPITAL_COMMUNITY): Payer: Self-pay

## 2021-07-17 ENCOUNTER — Other Ambulatory Visit: Payer: Self-pay

## 2021-07-17 DIAGNOSIS — R109 Unspecified abdominal pain: Secondary | ICD-10-CM | POA: Diagnosis present

## 2021-07-17 DIAGNOSIS — R1084 Generalized abdominal pain: Secondary | ICD-10-CM | POA: Insufficient documentation

## 2021-07-17 DIAGNOSIS — R112 Nausea with vomiting, unspecified: Secondary | ICD-10-CM | POA: Diagnosis not present

## 2021-07-17 LAB — COMPREHENSIVE METABOLIC PANEL
ALT: 19 U/L (ref 0–44)
AST: 21 U/L (ref 15–41)
Albumin: 4.2 g/dL (ref 3.5–5.0)
Alkaline Phosphatase: 44 U/L (ref 38–126)
Anion gap: 6 (ref 5–15)
BUN: 10 mg/dL (ref 6–20)
CO2: 26 mmol/L (ref 22–32)
Calcium: 9 mg/dL (ref 8.9–10.3)
Chloride: 106 mmol/L (ref 98–111)
Creatinine, Ser: 1.07 mg/dL (ref 0.61–1.24)
GFR, Estimated: >60 mL/min (ref >60)
Glucose, Bld: 98 mg/dL (ref 70–99)
Potassium: 3.5 mmol/L (ref 3.5–5.1)
Sodium: 138 mmol/L (ref 135–145)
Total Bilirubin: 1 mg/dL (ref 0.3–1.2)
Total Protein: 7.6 g/dL (ref 6.5–8.1)

## 2021-07-17 LAB — CBC
HCT: 43.8 % (ref 39.0–52.0)
Hemoglobin: 14.2 g/dL (ref 13.0–17.0)
MCH: 29.9 pg (ref 26.0–34.0)
MCHC: 32.4 g/dL (ref 30.0–36.0)
MCV: 92.2 fL (ref 80.0–100.0)
Platelets: 224 10*3/uL (ref 150–400)
RBC: 4.75 MIL/uL (ref 4.22–5.81)
RDW: 13.5 % (ref 11.5–15.5)
WBC: 3.9 10*3/uL — ABNORMAL LOW (ref 4.0–10.5)
nRBC: 0 % (ref 0.0–0.2)

## 2021-07-17 LAB — URINALYSIS, ROUTINE W REFLEX MICROSCOPIC
Bilirubin Urine: NEGATIVE
Glucose, UA: NEGATIVE mg/dL
Hgb urine dipstick: NEGATIVE
Ketones, ur: NEGATIVE mg/dL
Leukocytes,Ua: NEGATIVE
Nitrite: NEGATIVE
Protein, ur: NEGATIVE mg/dL
Specific Gravity, Urine: >1.046 — ABNORMAL HIGH (ref 1.005–1.030)
pH: 6 (ref 5.0–8.0)

## 2021-07-17 LAB — LIPASE, BLOOD: Lipase: 30 U/L (ref 11–51)

## 2021-07-17 MED ORDER — SODIUM CHLORIDE 0.9 % IV BOLUS
1000.0000 mL | Freq: Once | INTRAVENOUS | Status: AC
  Administered 2021-07-17: 1000 mL via INTRAVENOUS

## 2021-07-17 MED ORDER — FENTANYL CITRATE PF 50 MCG/ML IJ SOSY
50.0000 ug | PREFILLED_SYRINGE | Freq: Once | INTRAMUSCULAR | Status: AC
  Administered 2021-07-17: 50 ug via INTRAVENOUS
  Filled 2021-07-17: qty 1

## 2021-07-17 MED ORDER — ONDANSETRON HCL 4 MG/2ML IJ SOLN
4.0000 mg | Freq: Once | INTRAMUSCULAR | Status: AC
  Administered 2021-07-17: 4 mg via INTRAVENOUS
  Filled 2021-07-17: qty 2

## 2021-07-17 MED ORDER — IOHEXOL 300 MG/ML  SOLN
100.0000 mL | Freq: Once | INTRAMUSCULAR | Status: AC | PRN
  Administered 2021-07-17: 100 mL via INTRAVENOUS

## 2021-07-17 MED ORDER — IOHEXOL 350 MG/ML SOLN: 100.0000 mL | Freq: Once | INTRAVENOUS | Status: DC | PRN

## 2021-07-17 MED ORDER — SODIUM CHLORIDE (PF) 0.9 % IJ SOLN
INTRAMUSCULAR | Status: AC
  Filled 2021-07-17: qty 50

## 2021-07-17 NOTE — ED Provider Notes (Signed)
  Physical Exam  BP 103/77   Pulse (!) 55   Temp 98.3 F (36.8 C) (Oral)   Resp 16   Ht 6\' 1"  (1.854 m)   Wt 81.6 kg   SpO2 100%   BMI 23.75 kg/m   Physical Exam  Procedures  Procedures  ED Course / MDM    Medical Decision Making Care assumed at 5 PM.  Patient is here with lower abdominal pain.  Consideration was for possible renal colic versus appendicitis.  CT abdomen pelvis was unremarkable.  Signed out pending urinalysis to rule out UTI versus hematuria  5:48 PM Urinalysis is normal.  Per Dr. note, patient may have passed a kidney stone. At this point I recommend Motrin for several days.  Stable for discharge  Problems Addressed: Generalized abdominal pain: acute illness or injury  Amount and/or Complexity of Data Reviewed Labs: ordered. Decision-making details documented in ED Course. Radiology: ordered and independent interpretation performed. Decision-making details documented in ED Course.  Risk Prescription drug management.          Randel Books, MD 07/17/21 [phone]

## 2021-07-17 NOTE — ED Provider Notes (Signed)
[facility]-EMERGENCY DEPT Provider Note   CSN: [id number] Arrival date & time: 07/17/21  1442     History  Chief Complaint  Patient presents with   Abdominal Pain   Emesis    Douglas Bartlett is a 31 y.o. male.  Patient with history of HIV on treatment presenting with right-sided abdominal pain since 8 AM this morning.  States he was at work yesterday and developed severe suprapubic pain with nausea and vomiting and near syncope.  This pain lasted for several hours and resolved on its own.  He was able to eat and drink last night and felt improved.  However he woke up this morning with some discomfort to his right lower abdomen.  Denies any further nausea or vomiting.  No fever.  No pain with urination or blood in the urine.  No chest pain or shortness of breath.  No history of kidney stones.  Still has appendix and gallbladder.  The history is provided by the patient.  Abdominal Pain Associated symptoms: nausea and vomiting   Associated symptoms: no chest pain, no dysuria, no fever, no hematuria and no shortness of breath   Emesis Associated symptoms: abdominal pain   Associated symptoms: no arthralgias, no fever, no headaches and no myalgias        Home Medications Prior to Admission medications   Medication Sig Start Date End Date Taking? Authorizing Provider  bictegravir-emtricitabine-tenofovir AF (BIKTARVY) 50-200-25 MG TABS tablet Take 1 tablet by mouth daily. 06/25/21   Veryl Speak, FNP      Allergies    Patient has no known allergies.    Review of Systems   Review of Systems  Constitutional:  Positive for activity change and appetite change. Negative for fever.  HENT:  Negative for congestion and rhinorrhea.   Respiratory:  Negative for chest tightness and shortness of breath.   Cardiovascular:  Negative for chest pain.  Gastrointestinal:  Positive for abdominal pain, nausea and vomiting.  Genitourinary:  Negative for dysuria and  hematuria.  Musculoskeletal:  Negative for arthralgias and myalgias.  Skin:  Negative for rash.  Neurological:  Negative for dizziness, weakness and headaches.   all other systems are negative except as noted in the HPI and PMH.    Physical Exam Updated Vital Signs BP 133/77 (BP Location: Right Arm)   Pulse 69   Temp 98.3 F (36.8 C) (Oral)   Resp 16   Ht 6\' 1"  (1.854 m)   Wt 81.6 kg   SpO2 100%   BMI 23.75 kg/m  Physical Exam Vitals and nursing note reviewed.  Constitutional:      General: He is not in acute distress.    Appearance: He is well-developed.  HENT:     Head: Normocephalic and atraumatic.     Mouth/Throat:     Pharynx: No oropharyngeal exudate.  Eyes:     Conjunctiva/sclera: Conjunctivae normal.     Pupils: Pupils are equal, round, and reactive to light.  Neck:     Comments: No meningismus. Cardiovascular:     Rate and Rhythm: Normal rate and regular rhythm.     Heart sounds: Normal heart sounds. No murmur heard. Pulmonary:     Effort: Pulmonary effort is normal. No respiratory distress.     Breath sounds: Normal breath sounds.  Abdominal:     Palpations: Abdomen is soft.     Tenderness: There is abdominal tenderness. There is no guarding or rebound.     Comments: TTP RLQ and  periumbilical.  No guarding or rebound  Musculoskeletal:        General: No tenderness. Normal range of motion.     Cervical back: Normal range of motion and neck supple.  Skin:    General: Skin is warm.  Neurological:     Mental Status: He is alert and oriented to person, place, and time.     Cranial Nerves: No cranial nerve deficit.     Motor: No abnormal muscle tone.     Coordination: Coordination normal.     Comments:  5/5 strength throughout. CN 2-12 intact.Equal grip strength.   Psychiatric:        Behavior: Behavior normal.     ED Results / Procedures / Treatments   Labs (all labs ordered are listed, but only abnormal results are displayed) Labs Reviewed  CBC -  Abnormal; Notable for the following components:      Result Value   WBC 3.9 (*)    All other components within normal limits  LIPASE, BLOOD  COMPREHENSIVE METABOLIC PANEL  URINALYSIS, ROUTINE W REFLEX MICROSCOPIC    EKG None  Radiology CT ABDOMEN PELVIS W CONTRAST  Result Date: 07/17/2021 CLINICAL DATA:  Right lower quadrant pain EXAM: CT ABDOMEN AND PELVIS WITH CONTRAST TECHNIQUE: Multidetector CT imaging of the abdomen and pelvis was performed using the standard protocol following bolus administration of intravenous contrast. RADIATION DOSE REDUCTION: This exam was performed according to the departmental dose-optimization program which includes automated exposure control, adjustment of the mA and/or kV according to patient size and/or use of iterative reconstruction technique. CONTRAST:  OMNIPAQUE IOHEXOL 300 MG/ML  SOLN COMPARISON:  05/09/2017 FINDINGS: Lower chest: No acute abnormality Hepatobiliary: No focal hepatic abnormality. Gallbladder unremarkable. Pancreas: No focal abnormality or ductal dilatation. Spleen: No focal abnormality.  Normal size. Adrenals/Urinary Tract: No adrenal abnormality. No focal renal abnormality. No stones or hydronephrosis. Urinary bladder is unremarkable. Stomach/Bowel: Normal appendix. Stomach, large and small bowel grossly unremarkable. Vascular/Lymphatic: No evidence of aneurysm or adenopathy. Reproductive: No visible focal abnormality. Other: No free fluid or free air. Musculoskeletal: No acute bony abnormality. IMPRESSION: Normal appendix. No acute findings in the abdomen or pelvis. Electronically Signed   By: Charlett Nose M.D.   On: 07/17/2021 16:23    Procedures Procedures    Medications Ordered in ED Medications  sodium chloride 0.9 % bolus 1,000 mL (has no administration in time range)  ondansetron (ZOFRAN) injection 4 mg (has no administration in time range)  fentaNYL (SUBLIMAZE) injection 50 mcg (has no administration in time range)     ED Course/ Medical Decision Making/ A&P                           Medical Decision Making Amount and/or Complexity of Data Reviewed Labs: ordered. Radiology: ordered and independent interpretation performed. Decision-making details documented in ED Course. ECG/medicine tests: ordered and independent interpretation performed. Decision-making details documented in ED Course.  Risk Prescription drug management.   RLQ pain since 8 am. Suprapubic pain yesterday with nausea and vomiting. No fever.   Consider kidney stone, r/o appendicitis.   Lab work reassuring.  Stable leukopenia.  Urinalysis is pending.  CT scan shows no appendicitis.  No kidney stones.  Consider possibility of passed kidney stone.  Results reviewed and interpreted by me.  Awaiting urinalysis at shift change.  Discussed with patient he may have passed a kidney stone.  Discussed that appendicitis sometimes did not show up on CT  scan right away.  Return to the ED with worsening pain especially the right lower quadrant, fever, vomiting, not able to eat or drink or other concerns.        Final Clinical Impression(s) / ED Diagnoses Final diagnoses:  Generalized abdominal pain    Rx / DC Orders ED Discharge Orders     None         Louella Medaglia, Jeannett Senior, MD 07/17/21 1647

## 2021-07-17 NOTE — Discharge Instructions (Addendum)
Your appendix is normal. You may have passed a kidney stone.  Take motrin 600 mg every 6 hrs for pain    Followup with your primary doctor.   As we discussed, it's possible that appendicitis may not show up on a CT scan right away.   Return to the ED if you have worsening right sided abdominal pain, fever, vomiting, not able to eat or drink or any other concerns.

## 2021-07-17 NOTE — ED Triage Notes (Signed)
Patient c/o RLQ and mid lower abdominal pain that started yesterday, Patient denies vomiting today and states only has pain when he touches the mid lower abdomen. Patient states a normal BM yesterday.

## 2021-09-15 ENCOUNTER — Ambulatory Visit: Payer: Managed Care, Other (non HMO)

## 2021-09-15 ENCOUNTER — Other Ambulatory Visit: Payer: Self-pay

## 2021-09-15 ENCOUNTER — Other Ambulatory Visit (HOSPITAL_COMMUNITY): Payer: Self-pay

## 2021-10-12 NOTE — Progress Notes (Deleted)
Brief Narrative   Patient ID: Douglas Bartlett, male    DOB: [birth year], 31 y.o.   MRN: [id number]  Mr. Grandison is a 26 y/o AA male diagnosed with HIV disease in January 2018 with risk factor of MSM. Initial viral load of 41,259 with CD4 count of 340. Genotype with no significant resistance. Entered care at [facility] Stage 2. No opportunistic infection. [id number] negative. ART regimen of Triumeq and Biktarvy.   Subjective:    No chief complaint on file.   HPI:  Douglas Bartlett is a 31 y.o. male with HIV disease last seen on 06/25/2021 with adequately controlled virus and good adherence and tolerance to [place].  Viral load was undetectable with CD4 count of 427.  Kidney function, liver function, electrolytes within normal ranges.  Subsequently seen in the ED for generalized abdominal pain and diagnosed with possible renal stone. Here today for follow up.    No Known Allergies    Outpatient Medications Prior to Visit  Medication Sig Dispense Refill   bictegravir-emtricitabine-tenofovir AF (BIKTARVY) 50-200-25 MG TABS tablet Take 1 tablet by mouth daily. 30 tablet 5   No facility-administered medications prior to visit.     Past Medical History:  Diagnosis Date   Chicken pox      Past Surgical History:  Procedure Laterality Date   TONSILLECTOMY        Review of Systems  Constitutional:  Negative for appetite change, chills, fatigue, fever and unexpected weight change.  Eyes:  Negative for visual disturbance.  Respiratory:  Negative for cough, chest tightness, shortness of breath and wheezing.   Cardiovascular:  Negative for chest pain and leg swelling.  Gastrointestinal:  Negative for abdominal pain, constipation, diarrhea, nausea and vomiting.  Genitourinary:  Negative for dysuria, flank pain, frequency, genital sores, hematuria and urgency.  Skin:  Negative for rash.  Allergic/Immunologic: Negative for immunocompromised state.  Neurological:  Negative for dizziness and  headaches.      Objective:    There were no vitals taken for this visit. Nursing note and vital signs reviewed.  Physical Exam Constitutional:      General: He is not in acute distress.    Appearance: He is well-developed.  Eyes:     Conjunctiva/sclera: Conjunctivae normal.  Cardiovascular:     Rate and Rhythm: Normal rate and regular rhythm.     Heart sounds: Normal heart sounds. No murmur heard.    No friction rub. No gallop.  Pulmonary:     Effort: Pulmonary effort is normal. No respiratory distress.     Breath sounds: Normal breath sounds. No wheezing or rales.  Chest:     Chest wall: No tenderness.  Abdominal:     General: Bowel sounds are normal.     Palpations: Abdomen is soft.     Tenderness: There is no abdominal tenderness.  Musculoskeletal:     Cervical back: Neck supple.  Lymphadenopathy:     Cervical: No cervical adenopathy.  Skin:    General: Skin is warm and dry.     Findings: No rash.  Neurological:     Mental Status: He is alert and oriented to person, place, and time.  Psychiatric:        Behavior: Behavior normal.        Thought Content: Thought content normal.        Judgment: Judgment normal.         06/25/2021    8:55 AM 03/24/2021    9:17 AM 12/04/2020   10:23  AM 06/26/2020    8:50 AM 01/06/2017   11:06 AM  Depression screen PHQ 2/9  Decreased Interest 0 0 0 0 0  Down, Depressed, Hopeless 0 0 0 0 0  PHQ - 2 Score 0 0 0 0 0       Assessment & Plan:    Patient Active Problem List   Diagnosis Date Noted   Therapeutic drug monitoring 06/25/2021   Healthcare maintenance 06/26/2020   Screening examination for venereal disease 01/06/2017   Encounter for long-term (current) use of high-risk medication 01/06/2017   Need for prophylactic vaccination against Streptococcus pneumoniae (pneumococcus) 07/06/2016   Medication monitoring encounter 04/06/2016   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   No orders of the defined types were placed in this encounter.    Follow-up: No follow-ups on file.   Terri Piedra, MSN, FNP-C Nurse Practitioner [facility] for [facility] [place] number: [phone]

## 2021-10-13 ENCOUNTER — Ambulatory Visit: Payer: Self-pay | Admitting: Family

## 2021-11-05 ENCOUNTER — Ambulatory Visit (INDEPENDENT_AMBULATORY_CARE_PROVIDER_SITE_OTHER): Payer: Managed Care, Other (non HMO) | Admitting: Family

## 2021-11-05 ENCOUNTER — Encounter: Payer: Self-pay | Admitting: Family

## 2021-11-05 ENCOUNTER — Other Ambulatory Visit: Payer: Self-pay

## 2021-11-05 VITALS — BP 148/89 | HR 56 | Temp 98.2°F | Ht 73.0 in | Wt 190.0 lb

## 2021-11-05 DIAGNOSIS — B20 Human immunodeficiency virus [HIV] disease: Secondary | ICD-10-CM

## 2021-11-05 DIAGNOSIS — Z0000 Encounter for general adult medical examination without abnormal findings: Secondary | ICD-10-CM

## 2021-11-05 DIAGNOSIS — Z113 Encounter for screening for infections with a predominantly sexual mode of transmission: Secondary | ICD-10-CM

## 2021-11-05 MED ORDER — BICTEGRAVIR-EMTRICITAB-TENOFOV 50-200-25 MG PO TABS: 1.0000 | ORAL_TABLET | Freq: Every day | ORAL | 5 refills | Status: AC

## 2021-11-05 NOTE — Patient Instructions (Signed)
Nice to see you. ? ?We will check your lab work today. ? ?Continue to take your medication daily as prescribed. ? ?Refills have been sent to the pharmacy. ? ?Plan for follow up in 6 months or sooner if needed with lab work on the same day. ? ?Have a great day and stay safe! ? ?

## 2021-11-05 NOTE — Assessment & Plan Note (Signed)
Discussed importance of safe sexual practice and condom use. Condoms and STD testing offered.  Declines vaccines.  

## 2021-11-05 NOTE — Progress Notes (Signed)
Brief Narrative   Patient ID: Douglas Bartlett, male    DOB: 1990/02/10, 31 y.o.   MRN: [id number]  Douglas Bartlett is a 31 y/o AA male diagnosed with HIV disease in January 2018 with risk factor of MSM. Initial viral load of 41,259 with CD4 count of 340. Genotype with no significant resistance. Entered care at [facility] Stage 2. No opportunistic infection. [id number] negative. ART regimen of Triumeq and Biktarvy.     Subjective:    Chief Complaint  Patient presents with   Follow-up    HPI:  Douglas Bartlett is a 31 y.o. male with HIV disease last seen on 06/25/21 with well controlled virus and good adherence and tolerance to Biktarvy. Viral load was undetectable and CD4 count 427. Kidney function, liver function or electrolytes within normal ranges. Here today for routine follow up.  Douglas Bartlett has been doing well since his last office visit with no new concerns/complaints.  Continuing to work full-time at [employer] and has opportunity to travel to other places including [place].  Taking medication with no adverse side effects and no problems obtaining medication from the pharmacy.  Condoms and STD testing offered.  Declines vaccinations.  Denies fevers, chills, night sweats, headaches, changes in vision, neck pain/stiffness, nausea, diarrhea, vomiting, lesions or rashes.   No Known Allergies    Outpatient Medications Prior to Visit  Medication Sig Dispense Refill   bictegravir-emtricitabine-tenofovir AF (BIKTARVY) 50-200-25 MG TABS tablet Take 1 tablet by mouth daily. 30 tablet 5   No facility-administered medications prior to visit.     Past Medical History:  Diagnosis Date   Chicken pox      Past Surgical History:  Procedure Laterality Date   TONSILLECTOMY        Review of Systems  Constitutional:  Negative for appetite change, chills, fatigue, fever and unexpected weight change.  Eyes:  Negative for visual disturbance.  Respiratory:  Negative for cough, chest  tightness, shortness of breath and wheezing.   Cardiovascular:  Negative for chest pain and leg swelling.  Gastrointestinal:  Negative for abdominal pain, constipation, diarrhea, nausea and vomiting.  Genitourinary:  Negative for dysuria, flank pain, frequency, genital sores, hematuria and urgency.  Skin:  Negative for rash.  Allergic/Immunologic: Negative for immunocompromised state.  Neurological:  Negative for dizziness and headaches.      Objective:    BP (!) 148/89   Pulse (!) 56   Temp 98.2 F (36.8 C) (Temporal)   Ht 6\' 1"  (1.854 m)   Wt 190 lb (86.2 kg)   SpO2 100%   BMI 25.07 kg/m  Nursing note and vital signs reviewed.  Physical Exam Constitutional:      General: He is not in acute distress.    Appearance: He is well-developed.  Eyes:     Conjunctiva/sclera: Conjunctivae normal.  Cardiovascular:     Rate and Rhythm: Normal rate and regular rhythm.     Heart sounds: Normal heart sounds. No murmur heard.    No friction rub. No gallop.  Pulmonary:     Effort: Pulmonary effort is normal. No respiratory distress.     Breath sounds: Normal breath sounds. No wheezing or rales.  Chest:     Chest wall: No tenderness.  Abdominal:     General: Bowel sounds are normal.     Palpations: Abdomen is soft.     Tenderness: There is no abdominal tenderness.  Musculoskeletal:     Cervical back: Neck supple.  Lymphadenopathy:  Cervical: No cervical adenopathy.  Skin:    General: Skin is warm and dry.     Findings: No rash.  Neurological:     Mental Status: He is alert and oriented to person, place, and time.  Psychiatric:        Behavior: Behavior normal.        Thought Content: Thought content normal.        Judgment: Judgment normal.         11/05/2021   10:10 AM 06/25/2021    8:55 AM 03/24/2021    9:17 AM 12/04/2020   10:23 AM 06/26/2020    8:50 AM  Depression screen PHQ 2/9  Decreased Interest 0 0 0 0 0  Down, Depressed, Hopeless 0 0 0 0 0  PHQ - 2 Score 0 0  0 0 0       Assessment & Plan:    Patient Active Problem List   Diagnosis Date Noted   Therapeutic drug monitoring 06/25/2021   Healthcare maintenance 06/26/2020   Screening examination for venereal disease 01/06/2017   Encounter for long-term (current) use of high-risk medication 01/06/2017   Need for prophylactic vaccination against Streptococcus pneumoniae (pneumococcus) 07/06/2016   Medication monitoring encounter 04/06/2016   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 Hulme continues to have well-controlled virus with good adherence and tolerance to [employer].  Reviewed previous lab work and discussed plan of care.  Check blood work today.  Continue current dose of Biktarvy.  Plan for follow-up in 6 months or sooner if needed with lab work on the same day.      Relevant Medications   bictegravir-emtricitabine-tenofovir AF (BIKTARVY) 50-200-25 MG TABS tablet   Other Relevant Orders   Comprehensive metabolic panel   HIV-1 RNA quant-no reflex-bld   T-helper cell (CD4)- ([facility] clinic only)   Healthcare maintenance    Discussed importance of safe sexual practice and condom use. Condoms and STD testing offered.  Declines vaccines.       Other Visit Diagnoses     Screening for STDs (sexually transmitted diseases)       Relevant Orders   RPR        I am having [name] maintain his bictegravir-emtricitabine-tenofovir AF.   Meds ordered this encounter  Medications   bictegravir-emtricitabine-tenofovir AF (BIKTARVY) 50-200-25 MG TABS tablet    Sig: Take 1 tablet by mouth daily.    Dispense:  30 tablet    Refill:  5    Order Specific Question:   Supervising Provider     Answer:   Carlyle Basques [[id number]]     Follow-up: Return in about 6 months (around 05/06/2022), or if symptoms worsen or fail to improve.   Terri Piedra, MSN, FNP-C Nurse Practitioner [facility] for Infectious Disease [place] number: [phone]

## 2021-11-05 NOTE — Assessment & Plan Note (Signed)
Douglas Bartlett continues to have well-controlled virus with good adherence and tolerance to [employer].  Reviewed previous lab work and discussed plan of care.  Check blood work today.  Continue current dose of Biktarvy.  Plan for follow-up in 6 months or sooner if needed with lab work on the same day.

## 2021-11-06 LAB — T-HELPER CELL (CD4) - (RCID CLINIC ONLY)
CD4 % Helper T Cell: 28 % — ABNORMAL LOW (ref 33–65)
CD4 T Cell Abs: 446 /uL (ref 400–1790)

## 2021-11-07 LAB — COMPREHENSIVE METABOLIC PANEL
AG Ratio: 1.5 (calc) (ref 1.0–2.5)
ALT: 17 U/L (ref 9–46)
AST: 19 U/L (ref 10–40)
Albumin: 4.6 g/dL (ref 3.6–5.1)
Alkaline phosphatase (APISO): 47 U/L (ref 36–130)
BUN: 9 mg/dL (ref 7–25)
CO2: 28 mmol/L (ref 20–32)
Calcium: 9.5 mg/dL (ref 8.6–10.3)
Chloride: 107 mmol/L (ref 98–110)
Creat: 1.1 mg/dL (ref 0.60–1.26)
Globulin: 3 g/dL (calc) (ref 1.9–3.7)
Glucose, Bld: 96 mg/dL (ref 65–99)
Potassium: 4.1 mmol/L (ref 3.5–5.3)
Sodium: 141 mmol/L (ref 135–146)
Total Bilirubin: 0.5 mg/dL (ref 0.2–1.2)
Total Protein: 7.6 g/dL (ref 6.1–8.1)

## 2021-11-07 LAB — HIV-1 RNA QUANT-NO REFLEX-BLD
HIV 1 RNA Quant: NOT DETECTED Copies/mL
HIV-1 RNA Quant, Log: NOT DETECTED Log cps/mL

## 2021-11-07 LAB — RPR: RPR Ser Ql: NONREACTIVE

## 2022-05-20 ENCOUNTER — Ambulatory Visit: Payer: Managed Care, Other (non HMO) | Admitting: Family

## 2022-06-15 ENCOUNTER — Ambulatory Visit: Payer: Managed Care, Other (non HMO) | Admitting: Family

## 2022-06-15 ENCOUNTER — Telehealth: Payer: Self-pay

## 2022-06-15 NOTE — Telephone Encounter (Signed)
Called patient to assist with rescheduling his missed appointment this morning, no answer. Left HIPAA compliant voicemail requesting callback.   Sandie Ano, RN

## 2022-10-26 ENCOUNTER — Other Ambulatory Visit: Payer: Self-pay | Admitting: Family

## 2022-10-26 ENCOUNTER — Telehealth: Payer: Self-pay

## 2022-10-26 DIAGNOSIS — Z21 Asymptomatic human immunodeficiency virus [HIV] infection status: Secondary | ICD-10-CM

## 2022-10-26 NOTE — Telephone Encounter (Signed)
Attempted to call patient regarding refill request for [facility]. Patient is overdue for appointment and will need to schedule follow up.  Left voicemail. Refills denied at this time. Juanita Laster, RMA

## 2023-09-13 ENCOUNTER — Telehealth: Payer: Self-pay

## 2023-09-13 NOTE — Telephone Encounter (Signed)
 Attempt to Re-Engage in Care  No office visit or HIV labs completed at [facility] within the last 12 months. Patient is considered out of care.   Last [facility] Visit: 11/05/21  Last HIV Viral Load:  HIV 1 RNA Quant  Date Value Ref Range Status  11/05/2021 Not Detected Copies/mL Final    Last CD4 Count:  CD4 T Cell Abs  Date Value Ref Range Status  11/05/2021 446 400 - 1,790 /uL Final    Medication Dispense History:   Dispensed Days Supply Quantity Provider Pharmacy  BIKTARVY 50/200/25MG  TABLETS 09/23/2022 30 30 each Calone, Gregory D, FNP [facility]...  BIKTARVY 50/200/25MG  TABLETS 08/15/2022 30 30 each Calone, Gregory D, FNP [facility] #...  BIKTARVY 50/200/25MG  TABLETS 07/12/2022 30 30 each Calone, Gregory D, FNP [facility] #...    Interventions: Florence Faden, no answer. Left HIPAA compliant voicemail requesting callback. MyChart message sent.   No updated in Care Everywhere. No labs in [facility].   Duration of Services: 10 minutes  Ayvah Caroll, BSN, Charity fundraiser

## 2023-11-22 ENCOUNTER — Telehealth: Payer: Self-pay

## 2023-11-22 NOTE — Telephone Encounter (Signed)
 Called [facility] to offer appointment, no answer. Left HIPAA compliant voicemail requesting callback.   Rodriques Badie, BSN, RN

## 2024-02-01 ENCOUNTER — Telehealth: Payer: Self-pay

## 2024-02-01 NOTE — Telephone Encounter (Signed)
 Called patient to schedule appointment, no answer. Left HIPAA compliant voicemail requesting callback.   Hamilton Marinello, BSN, RN
